# Patient Record
Sex: Male | Born: 1940 | Race: White | Hispanic: No | Marital: Married
Health system: Southern US, Community
[De-identification: ages and names within clinical notes are randomized; demographics above are authoritative.]

---

## 2007-02-08 ENCOUNTER — Ambulatory Visit: Payer: Self-pay | Admitting: Pulmonary Disease

## 2011-01-17 NOTE — Assessment & Plan Note (Signed)
[facility]                             PULMONARY OFFICE NOTE   CAMDAN, BURDI                     MRN:          [id number]  DATE:02/08/2007                            DOB:          [birth year]    HISTORY OF PRESENT ILLNESS:  The patient is a 70 year old gentleman who  I have been asked to see for obstructive sleep apnea.  The patient has  undergone nocturnal polysomnography on November 09, 2006 where he was found  to have moderate sleep apnea with respiratory disturbance index of 28  events per hour.  He was titrated to a final CPAP pressure of 8 cm  however continued to have breakthrough events.  The patient was seen by  Dr.  __________  and was started on CPAP and had great difficulty with  it.  The patient felt that he really could not wear this on a consistent  basis.  The patient has not been able to lose weight but has been using  a Breathe-Right strip which has helped somewhat with his sleep.  He  continues to have snoring as well as pauses in his breathing during  sleep.  He tentatively gets to bed between 10 and 11 and gets up at 6  a.m. to start his day, he is not rested upon arising.  The patient has  significant sleep pressure during the day with periods of inactivity but  feels that it does not impair his quality of life because he is retired.  He does not nap during the day but does have some pressure with long  driving.  He has no issues with short distance driving.  Of note, his  weight is neutral since his sleep study.   PAST MEDICAL HISTORY:  1. Hypertension.  2. History of sleep apnea.  3. Status post splenectomy.  4. __________  sinus surgery in 1984.   CURRENT MEDICATIONS:  1. Univasc 75 mg daily.  2. Aspirin 81 mg daily.   THE PATIENT HAS NO KNOWN DRUG ALLERGIES.   SOCIAL HISTORY:  He is married.  He has a history of smoking less than a  pack per day for 6 years, he has not smoked since 1967.   FAMILY HISTORY:   Remarkable for his father having asthma and heart  disease as well as prostate cancer.  His mother had breast cancer.   REVIEW OF SYSTEMS:  As per history of present illness, also see the  patient intake form documented in the chart.   PHYSICAL EXAMINATION:  GENERAL:  He is an overweight male in no acute  distress.  Blood pressure 130/78, pulse 80, temperature is 98, weight is  211 pounds, he is 5 foot 10 inches tall, O2 saturation on room air is  98%.  HEENT:  Pupils equal round, reactive to light and accommodation.  Extraocular muscles are intact, nares are narrow but patent.  Oropharynx  does show very long uvula with mild elongation of the soft palate.  NECK:  Supple without JVD or lymphadenopathy, without palpable  thyromegaly.  CHEST:  Totally clear.  CARDIO:  Reveals regular  rate and rhythm.  No murmurs, rubs, or gallops.  ABDOMEN:  Soft, nontender, with good bowel sounds.  GENITAL, RECTAL, BREAST:  Exam was not done and not indicated.  LOWER EXTREMITIES:  Are without edema and pulses are intact distally.  NEUROLOGICALLY:  He is alert and oriented, no obvious motor deficits.   IMPRESSION:  History of moderate obstructive sleep apnea.  Even if the  patient does not sleep as well as he would like to and does have sleep  pressure during the day with periods of inactivity he really does not  feel that it impairs his quality of life.  Because he is retired he is  able to adjust his schedule appropriately and is not sure that he wants  to go through aggressive treatment from a quality of life standpoint.  I  have also discussed the possibility of cardiovascular risk with him, but  I do not think it is overly excessive.  The patient states that the  cardiovascular risk may not be an issue for him.  At this point in time  I have outlined the treatment options including continuous positive  airway pressure as well as upper airway surgery and also oral appliance.  I had told him that he  really has not given continuous positive airway  pressure a fair shot, in that there are a lot of things we can do with  the various machines and masks in order to make it more comfortable for  him.  He would like to take some time and think about his various  options and will get back with me if he decides to pursue aggressive  treatment.   PLAN:  1. Work aggressively on weight loss.  2. The patient is thinking about retrying CPAP versus some of the      options.  He states that he may chose not to treat this at all.     Barbaraann Share, MD,FCCP  Electronically Signed    [facility]  DD: 02/14/2007  DT: 02/14/2007  Job #: [id number]   cc:   Dina Rich

## 2011-10-24 ENCOUNTER — Other Ambulatory Visit (HOSPITAL_COMMUNITY)
Admission: RE | Admit: 2011-10-24 | Discharge: 2011-10-24 | Disposition: A | Discharge status: Home / Self Care | Payer: Medicare Other | Source: Ambulatory Visit | Attending: Pathology | Admitting: Pathology

## 2011-10-24 DIAGNOSIS — C8589 Other specified types of non-Hodgkin lymphoma, extranodal and solid organ sites: Secondary | ICD-10-CM | POA: Insufficient documentation

## 2012-01-09 ENCOUNTER — Other Ambulatory Visit: Payer: Self-pay | Admitting: Dermatology

## 2012-07-15 ENCOUNTER — Other Ambulatory Visit: Payer: Self-pay | Admitting: Gastroenterology

## 2012-07-15 DIAGNOSIS — R1031 Right lower quadrant pain: Secondary | ICD-10-CM

## 2012-07-18 ENCOUNTER — Ambulatory Visit
Admission: RE | Admit: 2012-07-18 | Discharge: 2012-07-18 | Disposition: A | Discharge status: Home / Self Care | Payer: Medicare Other | Source: Ambulatory Visit | Attending: Gastroenterology | Admitting: Gastroenterology

## 2012-07-18 DIAGNOSIS — R1031 Right lower quadrant pain: Secondary | ICD-10-CM

## 2012-07-18 MED ORDER — IOHEXOL 350 MG/ML SOLN
30.0000 mL | Freq: Once | INTRAVENOUS | Status: AC | PRN
  Administered 2012-07-18: 30 mL via ORAL

## 2012-07-18 MED ORDER — IOHEXOL 300 MG/ML  SOLN
125.0000 mL | Freq: Once | INTRAMUSCULAR | Status: AC | PRN
  Administered 2012-07-18: 125 mL via INTRAVENOUS

## 2015-11-04 DIAGNOSIS — M2012 Hallux valgus (acquired), left foot: Secondary | ICD-10-CM | POA: Insufficient documentation

## 2015-11-04 DIAGNOSIS — M7742 Metatarsalgia, left foot: Secondary | ICD-10-CM | POA: Insufficient documentation

## 2015-11-04 DIAGNOSIS — M2021 Hallux rigidus, right foot: Secondary | ICD-10-CM | POA: Insufficient documentation

## 2015-11-04 DIAGNOSIS — M2042 Other hammer toe(s) (acquired), left foot: Secondary | ICD-10-CM | POA: Insufficient documentation

## 2015-12-06 ENCOUNTER — Other Ambulatory Visit: Payer: Self-pay | Admitting: Nurse Practitioner

## 2015-12-06 ENCOUNTER — Ambulatory Visit
Admission: RE | Admit: 2015-12-06 | Discharge: 2015-12-06 | Disposition: A | Discharge status: Home / Self Care | Payer: Medicare Other | Source: Ambulatory Visit | Attending: Nurse Practitioner | Admitting: Nurse Practitioner

## 2015-12-06 DIAGNOSIS — M25551 Pain in right hip: Secondary | ICD-10-CM

## 2016-05-23 ENCOUNTER — Other Ambulatory Visit: Payer: Self-pay | Admitting: Internal Medicine

## 2016-05-23 DIAGNOSIS — Z0000 Encounter for general adult medical examination without abnormal findings: Secondary | ICD-10-CM

## 2016-06-02 ENCOUNTER — Ambulatory Visit
Admission: RE | Admit: 2016-06-02 | Discharge: 2016-06-02 | Disposition: A | Discharge status: Home / Self Care | Payer: Medicare Other | Source: Ambulatory Visit | Attending: Internal Medicine | Admitting: Internal Medicine

## 2016-06-02 DIAGNOSIS — Z0000 Encounter for general adult medical examination without abnormal findings: Secondary | ICD-10-CM

## 2017-01-09 ENCOUNTER — Other Ambulatory Visit: Payer: Self-pay | Admitting: Otolaryngology

## 2017-01-09 DIAGNOSIS — H903 Sensorineural hearing loss, bilateral: Secondary | ICD-10-CM

## 2017-01-09 DIAGNOSIS — H9312 Tinnitus, left ear: Secondary | ICD-10-CM

## 2017-01-30 ENCOUNTER — Ambulatory Visit
Admission: RE | Admit: 2017-01-30 | Discharge: 2017-01-30 | Disposition: A | Discharge status: Home / Self Care | Payer: Medicare Other | Source: Ambulatory Visit | Attending: Otolaryngology | Admitting: Otolaryngology

## 2017-01-30 DIAGNOSIS — H903 Sensorineural hearing loss, bilateral: Secondary | ICD-10-CM

## 2017-01-30 DIAGNOSIS — H9312 Tinnitus, left ear: Secondary | ICD-10-CM

## 2017-01-30 MED ORDER — GADOBENATE DIMEGLUMINE 529 MG/ML IV SOLN
20.0000 mL | Freq: Once | INTRAVENOUS | Status: AC | PRN
  Administered 2017-01-30: 20 mL via INTRAVENOUS

## 2017-02-19 ENCOUNTER — Telehealth (HOSPITAL_COMMUNITY): Payer: Self-pay | Admitting: Radiology

## 2017-02-19 NOTE — Telephone Encounter (Signed)
Called pt to schedule consult/cerebral angiogram at the request of Dr. Thornell Mule. Pt has an appt with Dr. Thornell Mule on 02/20/17 and will call us back after that visit to schedule. JM

## 2017-02-21 ENCOUNTER — Other Ambulatory Visit: Payer: Self-pay | Admitting: Otolaryngology

## 2017-02-21 DIAGNOSIS — H9312 Tinnitus, left ear: Secondary | ICD-10-CM

## 2017-02-23 ENCOUNTER — Ambulatory Visit
Admission: RE | Admit: 2017-02-23 | Discharge: 2017-02-23 | Disposition: A | Discharge status: Home / Self Care | Payer: Medicare Other | Source: Ambulatory Visit | Attending: Otolaryngology | Admitting: Otolaryngology

## 2017-02-23 DIAGNOSIS — H9312 Tinnitus, left ear: Secondary | ICD-10-CM

## 2017-05-29 ENCOUNTER — Ambulatory Visit
Admission: RE | Admit: 2017-05-29 | Discharge: 2017-05-29 | Disposition: A | Discharge status: Home / Self Care | Payer: Medicare Other | Source: Ambulatory Visit | Attending: Internal Medicine | Admitting: Internal Medicine

## 2017-05-29 ENCOUNTER — Other Ambulatory Visit: Payer: Self-pay | Admitting: Internal Medicine

## 2017-05-29 DIAGNOSIS — M5441 Lumbago with sciatica, right side: Secondary | ICD-10-CM

## 2017-05-29 DIAGNOSIS — M25551 Pain in right hip: Secondary | ICD-10-CM

## 2017-06-04 ENCOUNTER — Ambulatory Visit: Payer: Medicare Other | Attending: Internal Medicine | Admitting: Rehabilitation

## 2017-06-04 ENCOUNTER — Encounter: Payer: Self-pay | Admitting: Rehabilitation

## 2017-06-04 DIAGNOSIS — M79604 Pain in right leg: Secondary | ICD-10-CM | POA: Insufficient documentation

## 2017-06-04 DIAGNOSIS — M545 Low back pain: Secondary | ICD-10-CM | POA: Insufficient documentation

## 2017-06-04 DIAGNOSIS — G8929 Other chronic pain: Secondary | ICD-10-CM | POA: Insufficient documentation

## 2017-06-04 DIAGNOSIS — M79605 Pain in left leg: Secondary | ICD-10-CM | POA: Diagnosis present

## 2017-06-04 NOTE — Therapy (Signed)
[address] [place], [place], [address] Phone: [phone]   Fax:  [phone]  Physical Therapy Evaluation  Patient Details  Name: Douglas Burch. MRN: [id number] Date of Birth: 12/19/1940 Referring Provider: Lavone Orn MD  Encounter Date: 06/04/2017      PT End of Session - 06/04/17 1640    Visit Number 1   Number of Visits 12   Date for PT Re-Evaluation 07/16/17   Authorization Type Medicare; 10th visit Gcode; KX 15th visit   PT Start Time 1402   PT Stop Time 1445   PT Time Calculation (min) 43 min   Activity Tolerance Patient tolerated treatment well   Behavior During Therapy [facility] for tasks assessed/performed      History reviewed. No pertinent past medical history.  History reviewed. No pertinent surgical history.  There were no vitals filed for this visit.       Subjective Assessment - 06/04/17 1405    Subjective Pt presents with pain in the back, hips, knees, and feet x years mainly due to OA.  xrays showing only mild degeneration in the hips, knees, and back.  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  Feels like walking has slowed down.     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 No/denies  pain only with movement   Pain Score 7    Pain Location Back  and bilateral hips   Pain Orientation Right;Left   Pain Descriptors / Indicators Aching;Stabbing   Pain Type Chronic pain   Pain Radiating Towards LEs   Pain Onset More than a month ago   Pain Frequency Intermittent   Aggravating Factors  mostly movement related, getting up and down from chairs   Pain Relieving Factors rest   Effect of Pain on Daily Activities limits ability to be active            [facility] PT Assessment - 06/04/17 0001      Assessment   Medical Diagnosis back, hip, and knee pain   Referring Provider Lavone Orn MD   Onset Date/Surgical Date 09/04/13   Next MD Visit unknown   Prior Therapy no     Precautions   Precautions None     Balance Screen   Has the patient fallen in the past 6 months No     [place] residence   Living Arrangements Spouse/significant other   Home Layout --  2 steps into home     Prior Function   Level of [place] Retired   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      Observation/Other Assessments   Focus on Therapeutic Outcomes (FOTO)  46% limited     Sensation   Additional Comments reports maybe decreased sensation     Functional Tests   Functional tests Sit to Stand;Single leg stance     Single Leg Stance   Comments 1" bil      Sit to Stand   Comments pain in low back x 5 15seconds     ROM / Strength   AROM / PROM / Strength AROM;PROM;Strength     AROM   AROM Assessment Site Lumbar  Lumbar Flexion fingers to mid shins with increased leg and back pain   Lumbar Extension decreased 50%   pain   Lumbar - Right Side Bend decreased 25%  pain   Lumbar - Left Side Bend decreased 25%   pain   Lumbar - Right Rotation decreased 25%  pain down leg   Lumbar - Left Rotation decreased 25%     PROM   Overall PROM Comments significant tightness bil hip PROM mainly muscular      Strength   Overall Strength Comments seated knee and ankle 5/5 bil   Strength Assessment Site Hip;Knee;Ankle   Right/Left Hip Right;Left   Right Hip Flexion 5/5   Right Hip External Rotation  5/5   Right Hip Internal Rotation 5/5   Right Hip ABduction 4+/5   Left Hip Flexion 5/5   Left Hip External Rotation 5/5   Left Hip Internal Rotation 5/5   Left Hip ABduction 4+/5   Right/Left Knee --   Right/Left Ankle --     Flexibility   Soft Tissue Assessment /Muscle Length yes   Hamstrings to 45 bil    Quadriceps 50% limited bil inprone   Piriformis tightness bil   Quadratus Lumborum tightness bil     Palpation   SI assessment  2/6 CPA all lumbar levels (pt with 6 lumbar per xray) pain with all especially L3-6   Palpation comment tightness and tenderness lumbar region            Objective measurements completed on examination: See above findings.                  PT Education - 06/04/17 1640    Education provided Yes   Education Details HEP, diagnosis, POC   Person(s) Educated Patient   Methods Explanation;Handout   Comprehension Verbalized understanding;Need further instruction             PT Long Term Goals - 06/04/17 1645      PT LONG TERM GOAL #1   Title Pt will improve lumbar AROM to painfree within available motion   Baseline pain with all motions   Time 6   Period Weeks   Status New   Target Date 07/16/17     PT LONG TERM GOAL #2   Title Pt will report being able to get out of his chair at home without difficulty   Time 6   Period Weeks   Status New   Target Date 07/16/17     PT LONG TERM GOAL #3   Title pt will report being able to get in and out of his car without limitations   Time 6   Period Weeks   Status New   Target Date 07/16/17     PT LONG TERM GOAL #4   Title Pt will be independent with HEP for continued mobility   Time 6   Period Weeks   Status New   Target Date 07/16/17                Plan - 06/04/17 1642    Clinical Impression Statement Pt presents with chronic back, hip, and knee pain limiting activity and mobility.  Objective findings today include excellent strength in the LEs upon MMT but significant tightness in bil LEs including hamstrings, quads, hip rotators, and lumbar spine.  Tightness and ttp in the lumbar spine globally with pain during CPAs of L3-6 (pt with 6 levels per xray)  Pt does report increased back pain after  eval from moving more than usual.     Clinical Presentation Stable   Clinical  Decision Making Low   Rehab Potential Good   PT Frequency 2x / week   PT Duration 6 weeks   PT Treatment/Interventions Electrical Stimulation;Moist Heat;Patient/family education;Therapeutic exercise;Therapeutic activities;Neuromuscular re-education;Manual techniques;Taping   PT Next Visit Plan review HEP with education on performance, begin LE/lumbar mobility and core strengthening   PT Home Exercise Plan given initial program 10/1   Consulted and Agree with Plan of Care Patient      Patient will benefit from skilled therapeutic intervention in order to improve the following deficits and impairments:  Decreased range of motion, Decreased mobility, Difficulty walking  Visit Diagnosis: Chronic midline low back pain without sciatica - Plan: PT plan of care cert/re-cert  Pain in left leg - Plan: PT plan of care cert/re-cert  Pain in right leg - Plan: PT plan of care cert/re-cert      G-Codes - 00/93/81 1649    Functional Assessment Tool Used (Outpatient Only) FOTO   Functional Limitation Mobility: Walking and moving around   Mobility: Walking and Moving Around Current Status (W2993) At least 40 percent but less than 60 percent impaired, limited or restricted   Mobility: Walking and Moving Around Goal Status (Z1696) At least 20 percent but less than 40 percent impaired, limited or restricted       Problem List There are no active problems to display for this patient.   Stark Bray, DPT, CMP 06/04/2017, 4:51 PM  [address] [place], [place], [address] Phone: [phone]   Fax:  [phone]  Name: Ion Gonnella. MRN: [id number] Date of Birth: [birth year]

## 2017-06-06 ENCOUNTER — Ambulatory Visit: Payer: Medicare Other | Admitting: Physical Therapy

## 2017-06-06 ENCOUNTER — Encounter: Payer: Self-pay | Admitting: Physical Therapy

## 2017-06-06 DIAGNOSIS — G8929 Other chronic pain: Secondary | ICD-10-CM

## 2017-06-06 DIAGNOSIS — M79604 Pain in right leg: Secondary | ICD-10-CM

## 2017-06-06 DIAGNOSIS — M545 Low back pain: Secondary | ICD-10-CM

## 2017-06-06 DIAGNOSIS — M79605 Pain in left leg: Secondary | ICD-10-CM

## 2017-06-06 NOTE — Therapy (Signed)
[address] [place], [place], [address] Phone: [phone]   Fax:  [phone]  Physical Therapy Treatment  Patient Details  Name: Douglas Burch. MRN: [id number] Date of Birth: 07/09/1941 Referring Provider: Lavone Orn MD  Encounter Date: 06/06/2017      PT End of Session - 06/06/17 0925    Visit Number 2   Date for PT Re-Evaluation 07/16/17   PT Start Time 0845   PT Stop Time 0927   PT Time Calculation (min) 42 min   Activity Tolerance Patient tolerated treatment well   Behavior During Therapy [facility] for tasks assessed/performed      History reviewed. No pertinent past medical history.  History reviewed. No pertinent surgical history.  There were no vitals filed for this visit.      Subjective Assessment - 06/06/17 0854    Subjective Pt reports that his back is a little more symptomatic today hue to the moves from the evaluation.    Currently in Pain? Yes   Pain Score 0-No pain                         [facility] Adult PT Treatment/Exercise - 06/06/17 0001      Exercises   Exercises Lumbar     Lumbar Exercises: Stretches   Passive Hamstring Stretch 10 seconds;5 reps   Single Knee to Chest Stretch 2 reps;10 seconds   Lower Trunk Rotation 3 reps;10 seconds     Lumbar Exercises: Aerobic   Stationary Bike NuStep L3 x 6 min     Lumbar Exercises: Machines for Strengthening   Other Lumbar Machine Exercise 25lb Rows and lats 2x10      Lumbar Exercises: Supine   Ab Set 10 reps;3 seconds   Bridge 10 reps;2 seconds                     PT Long Term Goals - 06/06/17 0929      PT LONG TERM GOAL #4   Title Pt will be independent with HEP for continued mobility   Status Partially Met               Plan - 06/06/17 0927    Clinical Impression Statement Reviewed HEP, Pt tolerated an initial progression to exercises well. Bilat HS tightness noted with  passive HS stretch and lower trunk rotations. Little ROM with supine bridges. No reports of increase pain with activity. Denied modality post treatment.   Rehab Potential Good   PT Frequency 2x / week   PT Duration 6 weeks   PT Next Visit Plan begin LE/lumbar mobility and core strengthening      Patient will benefit from skilled therapeutic intervention in order to improve the following deficits and impairments:  Decreased range of motion, Decreased mobility, Difficulty walking  Visit Diagnosis: Pain in left leg  Chronic midline low back pain without sciatica  Pain in right leg     Problem List There are no active problems to display for this patient.   Scot Jun, PTA 06/06/2017, 9:30 AM  [address] [place], [place], [address] Phone: [phone]   Fax:  [phone]  Name: Michail Boyte. MRN: [id number] Date of Birth: 1941-04-06

## 2017-06-11 ENCOUNTER — Ambulatory Visit: Payer: Medicare Other | Admitting: Physical Therapy

## 2017-06-11 ENCOUNTER — Encounter: Payer: Self-pay | Admitting: Physical Therapy

## 2017-06-11 DIAGNOSIS — M545 Low back pain: Secondary | ICD-10-CM | POA: Diagnosis not present

## 2017-06-11 DIAGNOSIS — G8929 Other chronic pain: Secondary | ICD-10-CM

## 2017-06-11 DIAGNOSIS — M79604 Pain in right leg: Secondary | ICD-10-CM

## 2017-06-11 DIAGNOSIS — M79605 Pain in left leg: Secondary | ICD-10-CM

## 2017-06-11 NOTE — Therapy (Signed)
[address] [place], [place], [address] Phone: [phone]   Fax:  [phone]  Physical Therapy Treatment  Patient Details  Name: Douglas Burch. MRN: [id number] Date of Birth: 07-Nov-1940 Referring Provider: Lavone Orn MD  Encounter Date: 06/11/2017      PT End of Session - 06/11/17 1735    Visit Number 3   Date for PT Re-Evaluation 07/16/17   Authorization Type Medicare; 10th visit Gcode; KX 15th visit   PT Start Time 8366   PT Stop Time 1745   PT Time Calculation (min) 41 min   Activity Tolerance Patient tolerated treatment well   Behavior During Therapy [facility] for tasks assessed/performed      History reviewed. No pertinent past medical history.  History reviewed. No pertinent surgical history.  There were no vitals filed for this visit.      Subjective Assessment - 06/11/17 1706    Subjective Reports that he is doing a little better, reports that he is sore with any twisting motions   Currently in Pain? Yes   Pain Score 2    Pain Location Back   Aggravating Factors  twisting                         [facility] Adult PT Treatment/Exercise - 06/11/17 0001      Lumbar Exercises: Stretches   Passive Hamstring Stretch 4 reps;20 seconds   Piriformis Stretch 4 reps;20 seconds     Lumbar Exercises: Aerobic   Stationary Bike NuStep L5 x 6 min     Lumbar Exercises: Machines for Strengthening   Other Lumbar Machine Exercise 25lb Rows and lats 2x10      Lumbar Exercises: Standing   Other Standing Lumbar Exercises hip extension 10x each leg   Other Standing Lumbar Exercises green tband anti rotation press     Lumbar Exercises: Supine   Large Ball Abdominal Isometric 15 reps;2 seconds   Other Supine Lumbar Exercises feet on ball K2C, rotation and small bridges                     PT Long Term Goals - 06/11/17 1742      PT LONG TERM GOAL #1   Title Pt will  improve lumbar AROM to painfree within available motion   Status On-going     PT LONG TERM GOAL #2   Title Pt will report being able to get out of his chair at home without difficulty   Status On-going               Plan - 06/11/17 1740    Clinical Impression Statement Patient had some pain with small bridges and with hip extension.  Needed cues to go slow and do small motions in a painfree ROM   PT Next Visit Plan continue with core stability going slow   Consulted and Agree with Plan of Care Patient      Patient will benefit from skilled therapeutic intervention in order to improve the following deficits and impairments:  Decreased range of motion, Decreased mobility, Difficulty walking  Visit Diagnosis: Pain in left leg  Chronic midline low back pain without sciatica  Pain in right leg     Problem List There are no active problems to display for this patient.   Sumner Boast., PT 06/11/2017, 5:42 PM  [address]  [place], [place], [address] Phone: [phone]   Fax:  [phone]  Name: Douglas Burch. MRN: [id number] Date of Birth: April 29, 1941

## 2017-06-14 ENCOUNTER — Encounter: Payer: Self-pay | Admitting: Physical Therapy

## 2017-06-14 ENCOUNTER — Ambulatory Visit: Payer: Medicare Other | Admitting: Physical Therapy

## 2017-06-14 DIAGNOSIS — M545 Low back pain, unspecified: Secondary | ICD-10-CM

## 2017-06-14 DIAGNOSIS — G8929 Other chronic pain: Secondary | ICD-10-CM

## 2017-06-14 DIAGNOSIS — M79604 Pain in right leg: Secondary | ICD-10-CM

## 2017-06-14 DIAGNOSIS — M79605 Pain in left leg: Secondary | ICD-10-CM

## 2017-06-14 NOTE — Therapy (Signed)
[address] [place], [place], [address] Phone: [phone]   Fax:  [phone]  Physical Therapy Treatment  Patient Details  Name: Douglas Burch. MRN: [id number] Date of Birth: Jan 12, 1941 Referring Provider: Lavone Orn MD  Encounter Date: 06/14/2017      PT End of Session - 06/14/17 1144    Visit Number 4   Date for PT Re-Evaluation 07/16/17   PT Start Time 1058   PT Stop Time 1144   PT Time Calculation (min) 46 min   Activity Tolerance Patient tolerated treatment well   Behavior During Therapy [facility] for tasks assessed/performed      History reviewed. No pertinent past medical history.  History reviewed. No pertinent surgical history.  There were no vitals filed for this visit.      Subjective Assessment - 06/14/17 1057    Subjective Pt reports that he is having some low back soreness because he has been leaning over working on his toilet seat   Currently in Pain? No/denies   Pain Score 0-No pain                         [facility] Adult PT Treatment/Exercise - 06/14/17 0001      Lumbar Exercises: Stretches   Passive Hamstring Stretch 4 reps;20 seconds   Piriformis Stretch 4 reps;20 seconds     Lumbar Exercises: Machines for Strengthening   Cybex Knee Flexion 25lb 2x10   Other Lumbar Machine Exercise 25lb Rows and lats 2x10      Lumbar Exercises: Standing   Other Standing Lumbar Exercises hip extension 10x each leg   Other Standing Lumbar Exercises anti rotation press 15lb x10     Lumbar Exercises: Supine   Large Ball Abdominal Isometric 15 reps;2 seconds   Other Supine Lumbar Exercises feet on ball K2C, rotation and small bridges                     PT Long Term Goals - 06/11/17 1742      PT LONG TERM GOAL #1   Title Pt will improve lumbar AROM to painfree within available motion   Status On-going     PT LONG TERM GOAL #2   Title Pt will report  being able to get out of his chair at home without difficulty   Status On-going               Plan - 06/14/17 1144    Clinical Impression Statement no pain reported with small bridges, postural cues give during seated rows and anti rotational pressed.   Rehab Potential Good   PT Frequency 2x / week   PT Treatment/Interventions Electrical Stimulation;Moist Heat;Patient/family education;Therapeutic exercise;Therapeutic activities;Neuromuscular re-education;Manual techniques;Taping   PT Next Visit Plan continue with core stability going slow      Patient will benefit from skilled therapeutic intervention in order to improve the following deficits and impairments:  Decreased range of motion, Decreased mobility, Difficulty walking  Visit Diagnosis: Pain in left leg  Pain in right leg  Chronic midline low back pain without sciatica     Problem List There are no active problems to display for this patient.   Scot Jun 06/14/2017, 11:45 AM  [address] [place], [place], [address] Phone: [phone]   Fax:  [phone]  Name: Douglas Burch. MRN: [id number] Date  of Birth: 25-Feb-1941

## 2017-06-22 ENCOUNTER — Ambulatory Visit: Payer: Medicare Other | Admitting: Physical Therapy

## 2017-06-22 ENCOUNTER — Encounter: Payer: Self-pay | Admitting: Physical Therapy

## 2017-06-22 DIAGNOSIS — M79605 Pain in left leg: Secondary | ICD-10-CM

## 2017-06-22 DIAGNOSIS — M545 Low back pain: Secondary | ICD-10-CM

## 2017-06-22 DIAGNOSIS — M79604 Pain in right leg: Secondary | ICD-10-CM

## 2017-06-22 DIAGNOSIS — G8929 Other chronic pain: Secondary | ICD-10-CM

## 2017-06-22 NOTE — Therapy (Signed)
[facility] [address] [place], [place], [address] Phone: [phone]   Fax:  [phone]  Physical Therapy Treatment  Patient Details  Name: Douglas Burch. MRN: [id number] Date of Birth: [birth year] Referring Provider: Kirby Funk MD  Encounter Date: 06/22/2017      PT End of Session - 06/22/17 1006    Visit Number 5   Date for PT Re-Evaluation 07/16/17   PT Start Time 0930   PT Stop Time 1019   PT Time Calculation (min) 49 min   Activity Tolerance Patient tolerated treatment well   Behavior During Therapy [facility] for tasks assessed/performed      History reviewed. No pertinent past medical history.  History reviewed. No pertinent surgical history.  There were no vitals filed for this visit.      Subjective Assessment - 06/22/17 0930    Subjective Reports that his back is feeling better.  REportst hat he has not done his exercises very much   Currently in Pain? No/denies                         [facility] Adult PT Treatment/Exercise - 06/22/17 0001      Lumbar Exercises: Stretches   Passive Hamstring Stretch 4 reps;20 seconds   Piriformis Stretch 4 reps;20 seconds     Lumbar Exercises: Aerobic   Stationary Bike NuStep L5 x 6 min     Lumbar Exercises: Machines for Strengthening   Cybex Knee Extension 5# 2x10   Cybex Knee Flexion 25lb 2x10   Leg Press 20# 2x10   Other Lumbar Machine Exercise 25lb Rows and lats 2x10      Lumbar Exercises: Standing   Other Standing Lumbar Exercises anti rotation press 15lb x10     Lumbar Exercises: Supine   Other Supine Lumbar Exercises feet on ball K2C, rotation and small bridges                     PT Long Term Goals - 06/22/17 1008      PT LONG TERM GOAL #1   Title Pt will improve lumbar AROM to painfree within available motion   Status Achieved     PT LONG TERM GOAL #2   Title Pt will report being able to get out of his  chair at home without difficulty   Status Partially Met     PT LONG TERM GOAL #3   Title pt will report being able to get in and out of his car without limitations   Status Partially Met     PT LONG TERM GOAL #4   Title Pt will be independent with HEP for continued mobility   Status Partially Met               Plan - 06/22/17 1007    Clinical Impression Statement Reports overall doing very well, wants to try the Y, yoga and water aerobics   PT Next Visit Plan educate on posture and body mechanics as well as gym activities and form   Consulted and Agree with Plan of Care Patient      Patient will benefit from skilled therapeutic intervention in order to improve the following deficits and impairments:  Decreased range of motion, Decreased mobility, Difficulty walking  Visit Diagnosis: Pain in left leg  Pain in right leg  Chronic midline low back pain without sciatica     Problem List There are  no active problems to display for this patient.   Analina Filla W.,PT 06/22/2017, 10:10 AM  [address] [place], [place], [address] Phone: [phone]   Fax:  [phone]  Name: Douglas Burch. MRN: [id number] Date of Birth: 1940-10-09

## 2017-06-25 ENCOUNTER — Ambulatory Visit: Payer: Medicare Other | Admitting: Physical Therapy

## 2017-06-25 ENCOUNTER — Encounter: Payer: Self-pay | Admitting: Physical Therapy

## 2017-06-25 DIAGNOSIS — G8929 Other chronic pain: Secondary | ICD-10-CM

## 2017-06-25 DIAGNOSIS — M79604 Pain in right leg: Secondary | ICD-10-CM

## 2017-06-25 DIAGNOSIS — M545 Low back pain: Secondary | ICD-10-CM | POA: Diagnosis not present

## 2017-06-25 DIAGNOSIS — M79605 Pain in left leg: Secondary | ICD-10-CM

## 2017-06-25 NOTE — Therapy (Signed)
[address] [place], [place], [address] Phone: [phone]   Fax:  [phone]  Physical Therapy Treatment  Patient Details  Name: Douglas Burch. MRN: [id number] Date of Birth: [birth year] Referring Provider: Lavone Orn MD  Encounter Date: 06/25/2017      PT End of Session - 06/25/17 1057    Visit Number 6   Date for PT Re-Evaluation 07/16/17   PT Start Time 0930   PT Stop Time 1013   PT Time Calculation (min) 43 min   Activity Tolerance Patient tolerated treatment well   Behavior During Therapy [facility] for tasks assessed/performed      History reviewed. No pertinent past medical history.  History reviewed. No pertinent surgical history.  There were no vitals filed for this visit.      Subjective Assessment - 06/25/17 0934    Subjective Reports that he was pretty sore after the last visit.  Would like to hvae information about going to the gym   Currently in Pain? No/denies                         [facility] Adult PT Treatment/Exercise - 06/25/17 0001      Therapeutic Activites    Therapeutic Activities ADL's;Lifting   ADL's Vacuum, yardwork, lifting, foot prop, golfer's lift, in and out of car   Lifting reviewed HEP and gym activities, proper form, weight, reps and sets, how to slowly and safely progress, went over water aerobics vs on own, walking in water and kicking for exercises     Lumbar Exercises: Aerobic   Stationary Bike NuStep L5 x 6 min     Lumbar Exercises: Machines for Strengthening   Cybex Knee Extension 5# 2x10   Other Lumbar Machine Exercise 25lb Rows and lats 2x10                      PT Long Term Goals - 06/25/17 1058      PT LONG TERM GOAL #1   Title Pt will improve lumbar AROM to painfree within available motion   Status Achieved     PT LONG TERM GOAL #2   Title Pt will report being able to get out of his chair at home without  difficulty   Status Achieved     PT LONG TERM GOAL #3   Title pt will report being able to get in and out of his car without limitations   Status Partially Met     PT LONG TERM GOAL #4   Title Pt will be independent with HEP for continued mobility   Status Achieved               Plan - 06/25/17 1057    Clinical Impression Statement Patient doing very well, had questions about proper body mechanics and how to return to the gym safely.  We went over all of this and he feels good about it   PT Next Visit Plan hold for 1 month as he tries HEP and gym on his own   Consulted and Agree with Plan of Care Patient      Patient will benefit from skilled therapeutic intervention in order to improve the following deficits and impairments:  Decreased range of motion, Decreased mobility, Difficulty walking  Visit Diagnosis: Pain in left leg  Pain in right leg  Chronic midline low back pain without sciatica  G-Codes - 06/25/17 1059    Functional Assessment Tool Used (Outpatient Only) foto 37% limitation   Functional Limitation Mobility: Walking and moving around   Mobility: Walking and Moving Around Current Status [phone]) At least 20 percent but less than 40 percent impaired, limited or restricted   Mobility: Walking and Moving Around Goal Status [phone]) At least 20 percent but less than 40 percent impaired, limited or restricted      Problem List There are no active problems to display for this patient.   Sumner Boast., PT 06/25/2017, 11:04 AM  [address] [place], [place], [address] Phone: [phone]   Fax:  [phone]  Name: Douglas Burch. MRN: [id number] Date of Birth: 02-02-1941

## 2018-06-17 ENCOUNTER — Ambulatory Visit: Payer: Medicare Other | Attending: Internal Medicine | Admitting: Physical Therapy

## 2018-06-17 ENCOUNTER — Encounter: Payer: Self-pay | Admitting: Physical Therapy

## 2018-06-17 DIAGNOSIS — G8929 Other chronic pain: Secondary | ICD-10-CM | POA: Insufficient documentation

## 2018-06-17 DIAGNOSIS — M5441 Lumbago with sciatica, right side: Secondary | ICD-10-CM | POA: Insufficient documentation

## 2018-06-17 DIAGNOSIS — M79604 Pain in right leg: Secondary | ICD-10-CM | POA: Diagnosis present

## 2018-06-17 DIAGNOSIS — M62830 Muscle spasm of back: Secondary | ICD-10-CM | POA: Diagnosis present

## 2018-06-17 NOTE — Patient Instructions (Signed)
<HTML><META HTTP-EQUIV="content-type" CONTENT="text/html;charset=utf-8"><STRONG>             Access Code: [email or web address]       <BR>URL: [email or web address]       <BR><SPAN id="[email or web address]">Date: 06/17/2018</SPAN>                                                     <BR>Prepared by: Lum Babe                             </STRONG>      <BR><BR><SPAN>Exercises</SPAN>      <UL class="program-note-list"><LI>Supine ITB Stretch with Strap                 - 5 reps                 - 3 sets                 - 20 hold                         - 2x daily                         - 7x weekly                </LI><LI>Supine Piriformis Stretch Pulling Heel to Hip                 - 5 reps                 - 3 sets                 - 20 hold                         - 2x daily                         - 7x weekly                </LI><LI>Seated Hamstring Stretch with Chair                 - 5 reps                 - 3 sets                 - 60 hold                         - 2x daily                         - 7x weekly                </LI><LI>Standing Gastroc Stretch on Step with Counter Support                 - 5 reps                 - 3 sets                 - 20 hold                         -  2x daily                         - 7x weekly                </LI></UL>

## 2018-06-17 NOTE — Therapy (Signed)
[address] [place], [place], [address] Phone: [phone]   Fax:  [phone]  Physical Therapy Evaluation  Patient Details  Name: Douglas Burch. MRN: [id number] Date of Birth: 1941-02-11 Referring Provider (PT): Lavone Orn MD   Encounter Date: 06/17/2018  PT End of Session - 06/17/18 0837    Visit Number  1    Date for PT Re-Evaluation  08/17/18    PT Start Time  0800    PT Stop Time  0850    PT Time Calculation (min)  50 min    Activity Tolerance  Patient tolerated treatment well    Behavior During Therapy  [facility] for tasks assessed/performed       History reviewed. No pertinent past medical history.  History reviewed. No pertinent surgical history.  There were no vitals filed for this visit.   Subjective Assessment - 06/17/18 0805    Subjective  Patient was seen here last year with back pain with good results.  Reports that recently he has been having posterior right leg pain, at times down to the ankle, reports that his most frequent c/o is a cramping in the left calf.      Diagnostic tests  xray of lumbar spine showing 6 lumbar levels with DDD, mild OA bil hips and knees    Patient Stated Goals  have less pain    Currently in Pain?  Yes    Pain Score  3     Pain Location  Back    Pain Orientation  Lower;Right    Pain Descriptors / Indicators  Aching;Cramping    Pain Type  Acute pain    Pain Radiating Towards  right posterior leg worse in the right calf    Pain Onset  More than a month ago    Pain Frequency  Constant    Aggravating Factors   reports nothing really correlates the pain , but reports that when it hurts the pain is a 9/10    Pain Relieving Factors  reports that he has tried a lot of different things gets mild relief but does not go away    Effect of Pain on Daily Activities  limits activity         [facility] PT Assessment - 06/17/18 0001      Assessment   Medical  Diagnosis  LBP with right sciatica    Referring Provider (PT)  Lavone Orn MD    Onset Date/Surgical Date  05/18/18    Prior Therapy  over a year ago with good results      Precautions   Precautions  None      Balance Screen   Has the patient fallen in the past 6 months  No    Has the patient had a decrease in activity level because of a fear of falling?   No    Is the patient reluctant to leave their home because of a fear of falling?   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     Leisure  no exercise      Posture/Postural Control   Posture Comments  decreased  lordosis, slouched sitting posture      AROM   AROM Assessment Site  Lumbar    Lumbar Extension  decreased 50%     Lumbar - Right Side Bend  decreased 25%    Lumbar - Left Side Bend  decreased 50%     Lumbar - Right Rotation  decreased 50%    Lumbar - Left Rotation  decreased 50%      Strength   Overall Strength Comments  4/5 with LE's      Flexibility   Soft Tissue Assessment /Muscle Length  yes    Hamstrings  to 50 degrees pain doen the leg on the right    Piriformis  tight      Palpation   Palpation comment  very tender                 Objective measurements completed on examination: See above findings.      [place] Adult PT Treatment/Exercise - 06/17/18 0001      Modalities   Modalities  Electrical Stimulation;Moist Heat      Moist Heat Therapy   Number Minutes Moist Heat  15 Minutes    Moist Heat Location  Lumbar Spine      Electrical Stimulation   Electrical Stimulation Location  lumbar    Electrical Stimulation Action  IFC    Electrical Stimulation Parameters  supine    Electrical Stimulation Goals  Pain             PT  Education - 06/17/18 0837    Education provided  Yes    Education Details  Wms flexion    Person(s) Educated  Patient    Methods  Explanation;Demonstration;Handout    Comprehension  Verbalized understanding       PT Short Term Goals - 06/17/18 0840      PT SHORT TERM GOAL #1   Title  independent with initial HEP    Time  2    Period  Weeks    Status  New        PT Long Term Goals - 06/17/18 0840      PT LONG TERM GOAL #1   Title  Pt will improve lumbar AROM to painfree within available motion    Time  8    Period  Weeks    Status  New      PT LONG TERM GOAL #2   Title  Pt will report being able to get out of his chair at home without difficulty    Time  8    Period  Weeks    Status  New      PT LONG TERM GOAL #3   Title  decrease pain 50%    Time  8    Period  Weeks    Status  New      PT LONG TERM GOAL #4   Title  report 50% less calf pain    Time  8    Period  Weeks    Status  New             Plan - 06/17/18 3295    Clinical Impression Statement  Patient seen here about a year ago and got better fairly quickly, reports that a few months ago he started having back pain again with right leg pain and a cramping in the right calf.  X-rays show DDD.  Lumbar ROM is decreased 50% his LE's are tight.  Tender with spasms in  the lumbar and right buttock    Clinical Presentation  Stable    Clinical Decision Making  Low    PT Frequency  2x / week    PT Duration  6 weeks    PT Treatment/Interventions  Electrical Stimulation;Moist Heat;Patient/family education;Therapeutic exercise;Therapeutic activities;Neuromuscular re-education;Manual techniques;Taping;ADLs/Self Care Home Management;Traction;Cryotherapy;Dry needling    PT Next Visit Plan  start gym activites for flexibility and strength and may try traction    Consulted and Agree with Plan of Care  Patient       Patient will benefit from skilled therapeutic intervention in order to improve the following  deficits and impairments:  Decreased range of motion, Difficulty walking, Increased muscle spasms, Decreased activity tolerance, Pain, Improper body mechanics, Impaired flexibility, Decreased mobility, Decreased strength, Postural dysfunction  Visit Diagnosis: Chronic bilateral low back pain with right-sided sciatica - Plan: PT plan of care cert/re-cert  Muscle spasm of back - Plan: PT plan of care cert/re-cert     Problem List There are no active problems to display for this patient.   Sumner Boast., PT 06/17/2018, 8:42 AM  [address] [place], [place], [address] Phone: [phone]   Fax:  [phone]  Name: Douglas Burch. MRN: [id number] Date of Birth: 10/23/1940

## 2018-06-20 ENCOUNTER — Ambulatory Visit: Payer: Medicare Other | Admitting: Physical Therapy

## 2018-06-20 DIAGNOSIS — G8929 Other chronic pain: Secondary | ICD-10-CM

## 2018-06-20 DIAGNOSIS — M5441 Lumbago with sciatica, right side: Secondary | ICD-10-CM | POA: Diagnosis not present

## 2018-06-20 DIAGNOSIS — M62830 Muscle spasm of back: Secondary | ICD-10-CM

## 2018-06-20 NOTE — Therapy (Signed)
[address] [place], [place], [address] Phone: [phone]   Fax:  [phone]  Physical Therapy Treatment  Patient Details  Name: Douglas Burch. MRN: [id number] Date of Birth: 04/07/1941 Referring Provider (PT): Lavone Orn MD   Encounter Date: 06/20/2018  PT End of Session - 06/20/18 1040    Visit Number  2    Date for PT Re-Evaluation  08/17/18    PT Start Time  0845    PT Stop Time  0935    PT Time Calculation (min)  50 min    Activity Tolerance  Patient tolerated treatment well    Behavior During Therapy  [facility] for tasks assessed/performed       No past medical history on file.  No past surgical history on file.  There were no vitals filed for this visit.  Subjective Assessment - 06/20/18 0913    Subjective  Pt reports about 7/10 low back pain today with referred pain down into his Rt leg. He denies N/T but states if feels like a "charlie horse".  He also relays he is not sure if he is doing his stretches right.     Currently in Pain?  Yes    Pain Score  7     Pain Location  Back    Pain Orientation  Lower;Right    Pain Descriptors / Indicators  Aching;Cramping    Pain Type  Acute pain    Pain Radiating Towards  Rt leg down to calf                       [facility] Adult PT Treatment/Exercise - 06/20/18 0001      Exercises   Exercises  Lumbar      Lumbar Exercises: Stretches   Passive Hamstring Stretch  Right;Left;2 reps;30 seconds    Lower Trunk Rotation  --    ITB Stretch  Right;2 reps;30 seconds    Piriformis Stretch  Right;2 reps;30 seconds    Gastroc Stretch  Right;2 reps;30 seconds      Modalities   Modalities  Electrical Stimulation;Moist Heat      Moist Heat Therapy   Number Minutes Moist Heat  15 Minutes    Moist Heat Location  Lumbar Spine      Electrical Stimulation   Electrical Stimulation Location  lumbar    Electrical Stimulation Action  IFC    Electrical Stimulation Parameters  tried sitting today, 80-150 mhz to tolerance    Electrical Stimulation Goals  Pain      Manual Therapy   Manual therapy comments  STM, T.P relase to Rt lumbar P.S, QL and piriformis. Long Axis distraction/manual traction to Rt LE pt supine, 30 sec X 5 and then bilat LE 30 sec X 5. (this centralized leg symptoms and he only had pain in lower back that decreased in intensitiy)             PT Education - 06/20/18 1039    Education provided  Yes    Education Details  HEP review    Person(s) Educated  Patient    Methods  Explanation;Demonstration;Tactile cues;Verbal cues    Comprehension  Verbalized understanding;Returned demonstration       PT Short Term Goals - 06/17/18 0840      PT SHORT TERM GOAL #1   Title  independent with initial HEP    Time  2    Period  Weeks  Status  New        PT Long Term Goals - 06/17/18 0840      PT LONG TERM GOAL #1   Title  Pt will improve lumbar AROM to painfree within available motion    Time  8    Period  Weeks    Status  New      PT LONG TERM GOAL #2   Title  Pt will report being able to get out of his chair at home without difficulty    Time  8    Period  Weeks    Status  New      PT LONG TERM GOAL #3   Title  decrease pain 50%    Time  8    Period  Weeks    Status  New      PT LONG TERM GOAL #4   Title  report 50% less calf pain    Time  8    Period  Weeks    Status  New            Plan - 06/20/18 1040    Clinical Impression Statement  Session began with MHP and E-stim to decreaese pain and warm up muscles to allow for greater tissue extensibility for stretching. Pt was taken though HEP with education provided for technique. He was then able to show good return demonstration. He was then treated with MT for STM and T.P relase followed by manual traction. This centralized leg symptoms and he only had pain in lower back that decreased in intensitiy by the end of session. He had  great response to treatment today and may further benefit from mechanical traction.     PT Frequency  2x / week    PT Duration  6 weeks    PT Treatment/Interventions  Electrical Stimulation;Moist Heat;Patient/family education;Therapeutic exercise;Therapeutic activities;Neuromuscular re-education;Manual techniques;Taping;ADLs/Self Care Home Management;Traction;Cryotherapy;Dry needling    PT Next Visit Plan  start gym activites for flexibility and strength and may try traction    Consulted and Agree with Plan of Care  Patient       Patient will benefit from skilled therapeutic intervention in order to improve the following deficits and impairments:  Decreased range of motion, Difficulty walking, Increased muscle spasms, Decreased activity tolerance, Pain, Improper body mechanics, Impaired flexibility, Decreased mobility, Decreased strength, Postural dysfunction  Visit Diagnosis: Chronic bilateral low back pain with right-sided sciatica  Muscle spasm of back     Problem List There are no active problems to display for this patient.   Debbe Odea, PT, DPT 06/20/2018, 10:50 AM  [address] [place], [place], [address] Phone: [phone]   Fax:  [phone]  Name: Jaicob Dia. MRN: [id number] Date of Birth: 05/20/1941

## 2018-06-25 ENCOUNTER — Ambulatory Visit: Payer: Medicare Other | Admitting: Physical Therapy

## 2018-06-25 ENCOUNTER — Encounter: Payer: Self-pay | Admitting: Physical Therapy

## 2018-06-25 DIAGNOSIS — G8929 Other chronic pain: Secondary | ICD-10-CM

## 2018-06-25 DIAGNOSIS — M5441 Lumbago with sciatica, right side: Secondary | ICD-10-CM | POA: Diagnosis not present

## 2018-06-25 DIAGNOSIS — M62830 Muscle spasm of back: Secondary | ICD-10-CM

## 2018-06-25 DIAGNOSIS — M79604 Pain in right leg: Secondary | ICD-10-CM

## 2018-06-25 NOTE — Therapy (Signed)
[address] [place], [place], [address] Phone: [phone]   Fax:  [phone]  Physical Therapy Treatment  Patient Details  Name: Douglas Burch. MRN: [id number] Date of Birth: [birth year] Referring Provider (PT): Lavone Orn MD   Encounter Date: 06/25/2018  PT End of Session - 06/25/18 0947    Visit Number  3    Date for PT Re-Evaluation  08/17/18    PT Start Time  0852    PT Stop Time  0953    PT Time Calculation (min)  61 min    Activity Tolerance  Patient tolerated treatment well    Behavior During Therapy  [facility] for tasks assessed/performed       History reviewed. No pertinent past medical history.  History reviewed. No pertinent surgical history.  There were no vitals filed for this visit.  Subjective Assessment - 06/25/18 0854    Subjective  "Feeling good"     Currently in Pain?  Yes    Pain Score  3     Pain Location  Back   down R leg   Pain Orientation  Right                       [facility] Adult PT Treatment/Exercise - 06/25/18 0001      Exercises   Exercises  Lumbar      Lumbar Exercises: Stretches   Passive Hamstring Stretch  Right;Left;4 reps;10 seconds    Piriformis Stretch  Right;Left;4 reps;10 seconds    Other Lumbar Stretch Exercise  gastroc stretch 3x15''       Lumbar Exercises: Aerobic   UBE (Upper Arm Bike)  L3 x3 min     Recumbent Bike  L0 x4 min       Lumbar Exercises: Machines for Strengthening   Cybex Knee Extension  5lb x10     Cybex Knee Flexion  20lb x10    Other Lumbar Machine Exercise  Rows & lats 20lb 2x10      Lumbar Exercises: Seated   Sit to Stand  5 reps   x2 from mat table      Modalities   Modalities  Electrical Stimulation;Moist Heat      Moist Heat Therapy   Number Minutes Moist Heat  15 Minutes    Moist Heat Location  Lumbar Spine      Electrical Stimulation   Electrical Stimulation Location  lumbar    Electrical  Stimulation Action  IFC    Electrical Stimulation Parameters  supine    Electrical Stimulation Goals  Pain               PT Short Term Goals - 06/17/18 0840      PT SHORT TERM GOAL #1   Title  independent with initial HEP    Time  2    Period  Weeks    Status  New        PT Long Term Goals - 06/17/18 0840      PT LONG TERM GOAL #1   Title  Pt will improve lumbar AROM to painfree within available motion    Time  8    Period  Weeks    Status  New      PT LONG TERM GOAL #2   Title  Pt will report being able to get out of his chair at home without difficulty    Time  8  Period  Weeks    Status  New      PT LONG TERM GOAL #3   Title  decrease pain 50%    Time  8    Period  Weeks    Status  New      PT LONG TERM GOAL #4   Title  report 50% less calf pain    Time  8    Period  Weeks    Status  New            Plan - 06/25/18 8413    Clinical Impression Statement  Pt ~ 7 minutes late for today's treatment session. He tolerated a progression to TE well. Pt enter clinic reporting decrease pain. Postural cues needed for appropriate seated row execution. Some difficulty noted with sit to stands, cues needed to shift weight forward and to come to full standing. Tighten bilat HS with passive stretching, R piriformis was tighter than L.     PT Frequency  2x / week    PT Duration  6 weeks    PT Treatment/Interventions  Electrical Stimulation;Moist Heat;Patient/family education;Therapeutic exercise;Therapeutic activities;Neuromuscular re-education;Manual techniques;Taping;ADLs/Self Care Home Management;Traction;Cryotherapy;Dry needling    PT Next Visit Plan  start gym activites for flexibility and strength and may try traction       Patient will benefit from skilled therapeutic intervention in order to improve the following deficits and impairments:  Decreased range of motion, Difficulty walking, Increased muscle spasms, Decreased activity tolerance, Pain, Improper  body mechanics, Impaired flexibility, Decreased mobility, Decreased strength, Postural dysfunction  Visit Diagnosis: Chronic bilateral low back pain with right-sided sciatica  Muscle spasm of back  Pain in right leg     Problem List There are no active problems to display for this patient.   Scot Jun, PTA 06/25/2018, 9:58 AM  [address] [place], [place], [address] Phone: [phone]   Fax:  [phone]  Name: Douglas Burch. MRN: [id number] Date of Birth: 10-Jul-1941

## 2018-07-09 ENCOUNTER — Encounter: Payer: Medicare Other | Admitting: Physical Therapy

## 2018-07-16 ENCOUNTER — Encounter: Payer: Self-pay | Admitting: Physical Therapy

## 2018-07-16 ENCOUNTER — Ambulatory Visit: Payer: Medicare Other | Attending: Internal Medicine | Admitting: Physical Therapy

## 2018-07-16 DIAGNOSIS — M62830 Muscle spasm of back: Secondary | ICD-10-CM | POA: Diagnosis present

## 2018-07-16 DIAGNOSIS — G8929 Other chronic pain: Secondary | ICD-10-CM

## 2018-07-16 DIAGNOSIS — M79605 Pain in left leg: Secondary | ICD-10-CM | POA: Diagnosis present

## 2018-07-16 DIAGNOSIS — M5441 Lumbago with sciatica, right side: Secondary | ICD-10-CM | POA: Diagnosis present

## 2018-07-16 NOTE — Therapy (Signed)
[facility] [address] [place], [place], [address] Phone: [phone]   Fax:  [phone]  Physical Therapy Treatment  Patient Details  Name: Douglas Burch. MRN: [id number] Date of Birth: 1941/04/29 Referring Provider (PT): Lavone Orn MD   Encounter Date: 07/16/2018  PT End of Session - 07/16/18 1344    Visit Number  4    Date for PT Re-Evaluation  08/17/18    PT Start Time  1301    PT Stop Time  1400    PT Time Calculation (min)  59 min    Activity Tolerance  Patient tolerated treatment well    Behavior During Therapy  [facility] for tasks assessed/performed       History reviewed. No pertinent past medical history.  History reviewed. No pertinent surgical history.  There were no vitals filed for this visit.  Subjective Assessment - 07/16/18 1304    Subjective  Pt reports that he has been having do pain in her distal R shaft. "Not as bad today"    Currently in Pain?  Yes    Pain Score  3     Pain Location  Calf    Pain Orientation  Right         [facility] PT Assessment - 07/16/18 0001      AROM   Lumbar Extension  decreased 50%     Lumbar - Right Side Bend  WFL    Lumbar - Left Side Bend  WFL    Lumbar - Right Rotation  WFL    Lumbar - Left Rotation  [facility]                   [facility] Adult PT Treatment/Exercise - 07/16/18 0001      Exercises   Exercises  Lumbar      Lumbar Exercises: Stretches   Passive Hamstring Stretch  Right;Left;4 reps;10 seconds    Piriformis Stretch  Right;Left;4 reps;10 seconds    Other Lumbar Stretch Exercise  gastroc stretch 3x15''       Lumbar Exercises: Aerobic   Nustep  L4 x 6 min       Lumbar Exercises: Machines for Strengthening   Leg Press  30lb 2x10     Other Lumbar Machine Exercise  Rows & lats 25lb 2x10      Lumbar Exercises: Seated   Sit to Stand  10 reps      Lumbar Exercises: Supine   Bridge  Compliant;20 reps;2 seconds      Modalities   Modalities  Electrical Stimulation;Moist Heat      Moist Heat Therapy   Number Minutes Moist Heat  15 Minutes    Moist Heat Location  Lumbar Spine      Electrical Stimulation   Electrical Stimulation Location  lumbar    Electrical Stimulation Action  IFC    Electrical Stimulation Parameters  supine    Electrical Stimulation Goals  Pain               PT Short Term Goals - 06/17/18 0840      PT SHORT TERM GOAL #1   Title  independent with initial HEP    Time  2    Period  Weeks    Status  New        PT Long Term Goals - 07/16/18 1316      PT LONG TERM GOAL #1   Title  Pt will improve lumbar AROM to  painfree within available motion    Status  Partially Met      PT LONG TERM GOAL #2   Title  Pt will report being able to get out of his chair at home without difficulty    Status  Partially Met      PT LONG TERM GOAL #3   Title  decrease pain 50%    Status  Partially Met            Plan - 07/16/18 1345    Clinical Impression Statement  Pt return to PT after a trip, he reports some issues with pain in his L leg, but it was not bad today. Cues to shift wt forward with sit to stand, he will occasionally stand and fall back but to not shift wt forward. He did well with machine exercises. Very tight calves noted during stretching.     PT Frequency  2x / week    PT Duration  6 weeks    PT Treatment/Interventions  Electrical Stimulation;Moist Heat;Patient/family education;Therapeutic exercise;Therapeutic activities;Neuromuscular re-education;Manual techniques;Taping;ADLs/Self Care Home Management;Traction;Cryotherapy;Dry needling    PT Next Visit Plan  gym activities for flexibility and strength and may try traction       Patient will benefit from skilled therapeutic intervention in order to improve the following deficits and impairments:  Decreased range of motion, Difficulty walking, Increased muscle spasms, Decreased activity tolerance, Pain, Improper body  mechanics, Impaired flexibility, Decreased mobility, Decreased strength, Postural dysfunction  Visit Diagnosis: Chronic bilateral low back pain with right-sided sciatica  Muscle spasm of back  Pain in left leg     Problem List There are no active problems to display for this patient.   Douglas Burch, PTA 07/16/2018, 1:52 PM  [address] [place], [place], [address] Phone: [phone]   Fax:  [phone]  Name: Douglas Burch. MRN: [id number] Date of Birth: [birth year]

## 2018-07-22 ENCOUNTER — Encounter: Payer: Self-pay | Admitting: Physical Therapy

## 2018-07-22 ENCOUNTER — Ambulatory Visit: Payer: Medicare Other | Admitting: Physical Therapy

## 2018-07-22 DIAGNOSIS — M79605 Pain in left leg: Secondary | ICD-10-CM

## 2018-07-22 DIAGNOSIS — M62830 Muscle spasm of back: Secondary | ICD-10-CM

## 2018-07-22 DIAGNOSIS — M5441 Lumbago with sciatica, right side: Secondary | ICD-10-CM | POA: Diagnosis not present

## 2018-07-22 DIAGNOSIS — G8929 Other chronic pain: Secondary | ICD-10-CM

## 2018-07-22 NOTE — Therapy (Signed)
[address] [place], [place], [address] Phone: [phone]   Fax:  [phone]  Physical Therapy Treatment  Patient Details  Name: Douglas Burch. MRN: [id number] Date of Birth: 12/25/1940 Referring Provider (PT): Lavone Orn MD   Encounter Date: 07/22/2018  PT End of Session - 07/22/18 0926    Visit Number  5    Date for PT Re-Evaluation  08/17/18    PT Start Time  0845    PT Stop Time  0936    PT Time Calculation (min)  51 min    Activity Tolerance  Patient tolerated treatment well    Behavior During Therapy  [facility] for tasks assessed/performed       History reviewed. No pertinent past medical history.  History reviewed. No pertinent surgical history.  There were no vitals filed for this visit.  Subjective Assessment - 07/22/18 0850    Subjective  Pt stated that he is having less leg pain since last treatment.     Currently in Pain?  Yes    Pain Score  3     Pain Location  Calf    Pain Orientation  Right;Lateral                       [facility] Adult PT Treatment/Exercise - 07/22/18 0001      Exercises   Exercises  Lumbar      Lumbar Exercises: Stretches   Piriformis Stretch  Right;4 reps;10 seconds;20 seconds      Lumbar Exercises: Aerobic   Recumbent Bike  L1 x 4 min     Nustep  L4 x 6 min       Lumbar Exercises: Machines for Strengthening   Leg Press  40lb 2x15, 40lb heel raises 2x15     Other Lumbar Machine Exercise  Rows & lats 25lb 2x15      Lumbar Exercises: Standing   Other Standing Lumbar Exercises  Sit to stand form blue chair. x10   cues tokeep weith over toes   Other Standing Lumbar Exercises  6 in step up x10 each    Cues to lean over onto driving LE.     Moist Heat Therapy   Number Minutes Moist Heat  10 Minutes    Moist Heat Location  Lumbar Spine               PT Short Term Goals - 07/22/18 0946      PT SHORT TERM GOAL #1   Title   independent with initial HEP    Status  Achieved        PT Long Term Goals - 07/22/18 0901      PT LONG TERM GOAL #4   Title  report 50% less calf pain    Status  Partially Met            Plan - 07/22/18 0926    Clinical Impression Statement  Pt reports less pain in her R calf since last treatment. Increase workload on both LE in today's session. No increase in pain reports overall. Pt tens to have a posterior lean with functional interventions, losing his balance at times.     PT Frequency  2x / week    PT Duration  6 weeks    PT Treatment/Interventions  Electrical Stimulation;Moist Heat;Patient/family education;Therapeutic exercise;Therapeutic activities;Neuromuscular re-education;Manual techniques;Taping;ADLs/Self Care Home Management;Traction;Cryotherapy;Dry needling    PT Next Visit Plan  gym activities for  flexibility and strength and may try traction, functional interventions       Patient will benefit from skilled therapeutic intervention in order to improve the following deficits and impairments:  Decreased range of motion, Difficulty walking, Increased muscle spasms, Decreased activity tolerance, Pain, Improper body mechanics, Impaired flexibility, Decreased mobility, Decreased strength, Postural dysfunction  Visit Diagnosis: Chronic bilateral low back pain with right-sided sciatica  Muscle spasm of back  Pain in left leg     Problem List There are no active problems to display for this patient.   Scot Jun, PTA 07/22/2018, 9:46 AM  [address] [place], [place], [address] Phone: [phone]   Fax:  [phone]  Name: Douglas Burch. MRN: [id number] Date of Birth: November 03, 1940

## 2018-08-06 ENCOUNTER — Encounter: Payer: Self-pay | Admitting: Physical Therapy

## 2018-08-06 ENCOUNTER — Ambulatory Visit: Payer: Medicare Other | Attending: Internal Medicine | Admitting: Physical Therapy

## 2018-08-06 DIAGNOSIS — M5441 Lumbago with sciatica, right side: Secondary | ICD-10-CM | POA: Diagnosis present

## 2018-08-06 DIAGNOSIS — M79605 Pain in left leg: Secondary | ICD-10-CM

## 2018-08-06 DIAGNOSIS — G8929 Other chronic pain: Secondary | ICD-10-CM | POA: Diagnosis present

## 2018-08-06 DIAGNOSIS — M62830 Muscle spasm of back: Secondary | ICD-10-CM | POA: Diagnosis not present

## 2018-08-06 DIAGNOSIS — M545 Low back pain: Secondary | ICD-10-CM | POA: Insufficient documentation

## 2018-08-06 DIAGNOSIS — M79604 Pain in right leg: Secondary | ICD-10-CM | POA: Insufficient documentation

## 2018-08-06 NOTE — Therapy (Signed)
[address] [place], [place], [address] Phone: [phone]   Fax:  [phone]  Physical Therapy Treatment  Patient Details  Name: Douglas Burch. MRN: [id number] Date of Birth: [birth year] Referring Provider (PT): Lavone Orn MD   Encounter Date: 08/06/2018  PT End of Session - 08/06/18 0928    Visit Number  6    Date for PT Re-Evaluation  08/17/18    PT Start Time  0850    PT Stop Time  0940    PT Time Calculation (min)  50 min    Activity Tolerance  Patient tolerated treatment well    Behavior During Therapy  [facility] for tasks assessed/performed       History reviewed. No pertinent past medical history.  History reviewed. No pertinent surgical history.  There were no vitals filed for this visit.  Subjective Assessment - 08/06/18 0856    Subjective  "Leg are a lot better than It was, back is tolerable"    Diagnostic tests  xray of lumbar spine showing 6 lumbar levels with DDD, mild OA bil hips and knees    Currently in Pain?  No/denies    Pain Score  0-No pain                       [facility] Adult PT Treatment/Exercise - 08/06/18 0001      Exercises   Exercises  Lumbar      Lumbar Exercises: Aerobic   Elliptical  3 min R 3 I 5    UBE (Upper Arm Bike)  L3 x4 min       Lumbar Exercises: Machines for Strengthening   Leg Press  40lb 2x15, 40lb heel raises 2x15     Other Lumbar Machine Exercise  Rows & lats 35lb 2x10      Lumbar Exercises: Standing   Other Standing Lumbar Exercises  Sit to stand form blue chair. x10   cues to keep wt over toes   Other Standing Lumbar Exercises  Pulley ext 5lb 2x10       Moist Heat Therapy   Number Minutes Moist Heat  10 Minutes    Moist Heat Location  Lumbar Spine               PT Short Term Goals - 07/22/18 0946      PT SHORT TERM GOAL #1   Title  independent with initial HEP    Status  Achieved        PT Long Term Goals  - 08/06/18 3888      PT LONG TERM GOAL #1   Title  Pt will improve lumbar AROM to painfree within available motion    Status  Partially Met      PT LONG TERM GOAL #2   Title  Pt will report being able to get out of his chair at home without difficulty    Status  Partially Met      PT LONG TERM GOAL #3   Title  decrease pain 50%    Status  Achieved      PT LONG TERM GOAL #4   Title  report 50% less calf pain    Status  Partially Met            Plan - 08/06/18 0929    Clinical Impression Statement  Pt continues to reports less pain in his legs. No pain reported throughout session. Increase  weight tolerated with rows and lats. Cues needed during sit to stand to keep weight over toes. Pt has forward posture with decrease lumbar lordosis.    PT Frequency  2x / week    PT Duration  6 weeks    PT Next Visit Plan  gym activites for flexibility and strength and may try traction, functional interventions       Patient will benefit from skilled therapeutic intervention in order to improve the following deficits and impairments:  Decreased range of motion, Difficulty walking, Increased muscle spasms, Decreased activity tolerance, Pain, Improper body mechanics, Impaired flexibility, Decreased mobility, Decreased strength, Postural dysfunction  Visit Diagnosis: Muscle spasm of back  Chronic bilateral low back pain with right-sided sciatica  Pain in left leg  Pain in right leg     Problem List There are no active problems to display for this patient.   Scot Jun, PTA 08/06/2018, 9:30 AM  [address] [place], [place], [address] Phone: [phone]   Fax:  [phone]  Name: Douglas Burch. MRN: [id number] Date of Birth: [birth year]

## 2018-08-09 ENCOUNTER — Ambulatory Visit: Payer: Medicare Other | Admitting: Physical Therapy

## 2018-08-09 ENCOUNTER — Encounter: Payer: Self-pay | Admitting: Physical Therapy

## 2018-08-09 DIAGNOSIS — M62830 Muscle spasm of back: Secondary | ICD-10-CM

## 2018-08-09 DIAGNOSIS — M79605 Pain in left leg: Secondary | ICD-10-CM

## 2018-08-09 DIAGNOSIS — G8929 Other chronic pain: Secondary | ICD-10-CM

## 2018-08-09 DIAGNOSIS — M5441 Lumbago with sciatica, right side: Secondary | ICD-10-CM

## 2018-08-09 NOTE — Therapy (Signed)
[address] [place], [place], [address] Phone: [phone]   Fax:  [phone]  Physical Therapy Treatment  Patient Details  Name: Douglas Burch. MRN: [id number] Date of Birth: [birth year] Referring Provider (PT): Lavone Orn MD   Encounter Date: 08/09/2018  PT End of Session - 08/09/18 1007    Visit Number  7    Date for PT Re-Evaluation  08/17/18    PT Start Time  0930    PT Stop Time  1024    PT Time Calculation (min)  54 min    Activity Tolerance  Patient tolerated treatment well    Behavior During Therapy  [facility] for tasks assessed/performed       History reviewed. No pertinent past medical history.  History reviewed. No pertinent surgical history.  There were no vitals filed for this visit.  Subjective Assessment - 08/09/18 0934    Subjective  Pt reports that he may did too much having some low back pain that went down R leg.     Currently in Pain?  Yes    Pain Score  5     Pain Location  Leg    Pain Orientation  Right                       [facility] Adult PT Treatment/Exercise - 08/09/18 0001      Exercises   Exercises  Lumbar      Lumbar Exercises: Stretches   Single Knee to Chest Stretch  Left;Right;4 reps;10 seconds;20 seconds      Lumbar Exercises: Aerobic   UBE (Upper Arm Bike)  L3 x6 min       Lumbar Exercises: Machines for Strengthening   Other Lumbar Machine Exercise  Rows & lats 35lb 2x10      Lumbar Exercises: Standing   Other Standing Lumbar Exercises  Pulley ext 5lb 2x10       Lumbar Exercises: Supine   Bridge  Compliant;20 reps;2 seconds    Other Supine Lumbar Exercises  LE on on pball bridges, K2C, and oblk      Modalities   Modalities  Electrical Stimulation;Moist Heat      Moist Heat Therapy   Number Minutes Moist Heat  15 Minutes    Moist Heat Location  Lumbar Spine      Electrical Stimulation   Electrical Stimulation Location  lumbar     Electrical Stimulation Action  IFC    Electrical Stimulation Parameters  supine    Electrical Stimulation Goals  Pain               PT Short Term Goals - 07/22/18 0946      PT SHORT TERM GOAL #1   Title  independent with initial HEP    Status  Achieved        PT Long Term Goals - 08/06/18 8416      PT LONG TERM GOAL #1   Title  Pt will improve lumbar AROM to painfree within available motion    Status  Partially Met      PT LONG TERM GOAL #2   Title  Pt will report being able to get out of his chair at home without difficulty    Status  Partially Met      PT LONG TERM GOAL #3   Title  decrease pain 50%    Status  Achieved      PT LONG  TERM GOAL #4   Title  report 50% less calf pain    Status  Partially Met            Plan - 08/09/18 1014    Clinical Impression Statement  pt reports increase pain after last session so intervention scaled back. Pt had reported pain that went down R leg. Pt reports a decrease in pain after today's treatment before modality. Cues needed with standing shoulder extension in regards to maintain correct posture. Good strength with rows and extensions.  No issues with supine interventions, little motion with bridges.    PT Frequency  2x / week    PT Duration  6 weeks    PT Treatment/Interventions  Electrical Stimulation;Moist Heat;Patient/family education;Therapeutic exercise;Therapeutic activities;Neuromuscular re-education;Manual techniques;Taping;ADLs/Self Care Home Management;Traction;Cryotherapy;Dry needling    PT Next Visit Plan  gym activities for flexibility and strength and may try traction, functional interventions       Patient will benefit from skilled therapeutic intervention in order to improve the following deficits and impairments:  Decreased range of motion, Difficulty walking, Increased muscle spasms, Decreased activity tolerance, Pain, Improper body mechanics, Impaired flexibility, Decreased mobility, Decreased  strength, Postural dysfunction  Visit Diagnosis: Muscle spasm of back  Chronic bilateral low back pain with right-sided sciatica  Pain in left leg     Problem List There are no active problems to display for this patient.   Scot Jun, PTA 08/09/2018, 10:17 AM  [address] [place], [place], [address] Phone: [phone]   Fax:  [phone]  Name: Douglas Burch. MRN: [id number] Date of Birth: 05-Oct-1940

## 2018-08-14 ENCOUNTER — Encounter: Payer: Self-pay | Admitting: Physical Therapy

## 2018-08-14 ENCOUNTER — Ambulatory Visit: Payer: Medicare Other | Admitting: Physical Therapy

## 2018-08-14 DIAGNOSIS — M62830 Muscle spasm of back: Secondary | ICD-10-CM | POA: Diagnosis not present

## 2018-08-14 DIAGNOSIS — M79605 Pain in left leg: Secondary | ICD-10-CM

## 2018-08-14 DIAGNOSIS — G8929 Other chronic pain: Secondary | ICD-10-CM

## 2018-08-14 DIAGNOSIS — M5441 Lumbago with sciatica, right side: Principal | ICD-10-CM

## 2018-08-14 NOTE — Therapy (Signed)
[address] [place], [place], [address] Phone: [phone]   Fax:  [phone]  Physical Therapy Treatment  Patient Details  Name: Douglas Burch. MRN: [id number] Date of Birth: [birth year] Referring Provider (PT): Lavone Orn MD   Encounter Date: 08/14/2018  PT End of Session - 08/14/18 1004    Visit Number  8    Date for PT Re-Evaluation  08/17/18    PT Start Time  0930    PT Stop Time  1010    PT Time Calculation (min)  40 min    Activity Tolerance  Patient tolerated treatment well    Behavior During Therapy  [facility] for tasks assessed/performed       History reviewed. No pertinent past medical history.  History reviewed. No pertinent surgical history.  There were no vitals filed for this visit.  Subjective Assessment - 08/14/18 0933    Subjective  "All right" "Very little, a spot on my back" "Calves will hurt after the third walk with the dog"    Currently in Pain?  No/denies    Pain Score  0-No pain                       [facility] Adult PT Treatment/Exercise - 08/14/18 0001      Lumbar Exercises: Aerobic   UBE (Upper Arm Bike)  L3 x 4 min     Recumbent Bike  L1 x 5 min       Lumbar Exercises: Machines for Strengthening   Cybex Knee Extension  10lb 2x15     Cybex Knee Flexion  25lb 2x15     Leg Press  40lb 2x15    Other Lumbar Machine Exercise  Rows & lats 35lb 2x10      Lumbar Exercises: Standing   Other Standing Lumbar Exercises  Tricep ext 25lb 2x15     Other Standing Lumbar Exercises  Pulley ext 5lb 2x10                PT Short Term Goals - 07/22/18 0946      PT SHORT TERM GOAL #1   Title  independent with initial HEP    Status  Achieved        PT Long Term Goals - 08/14/18 1007      PT LONG TERM GOAL #1   Title  Pt will improve lumbar AROM to painfree within available motion    Status  Achieved      PT LONG TERM GOAL #2   Title  Pt will  report being able to get out of his chair at home without difficulty    Status  Partially Met      PT LONG TERM GOAL #3   Title  decrease pain 50%    Status  Achieved      PT LONG TERM GOAL #4   Title  report 50% less calf pain    Status  Achieved            Plan - 08/14/18 1005    Clinical Impression Statement  Pt reports that he felt good after last session and overall, denied modality post treatment. All exercises completed well without adverse effect. Good strength and ROM on leg press. Leg press was set at a lower position with knee flexion greater than 90    PT Frequency  2x / week    PT Duration  6 weeks  PT Treatment/Interventions  Occupational hygienist Heat;Patient/family education;Therapeutic exercise;Therapeutic activities;Neuromuscular re-education;Manual techniques;Taping;ADLs/Self Care Home Management;Traction;Cryotherapy;Dry needling    PT Next Visit Plan  gym activites for flexibility and strength and may try traction, functional interventions       Patient will benefit from skilled therapeutic intervention in order to improve the following deficits and impairments:  Decreased range of motion, Difficulty walking, Increased muscle spasms, Decreased activity tolerance, Pain, Improper body mechanics, Impaired flexibility, Decreased mobility, Decreased strength, Postural dysfunction  Visit Diagnosis: Chronic bilateral low back pain with right-sided sciatica  Muscle spasm of back  Pain in left leg     Problem List There are no active problems to display for this patient.   Scot Jun, PTA 08/14/2018, 10:07 AM  [address] [place], [place], [address] Phone: [phone]   Fax:  [phone]  Name: Douglas Burch. MRN: [id number] Date of Birth: [birth year]

## 2018-08-21 ENCOUNTER — Encounter: Payer: Self-pay | Admitting: Physical Therapy

## 2018-08-21 ENCOUNTER — Ambulatory Visit: Payer: Medicare Other | Admitting: Physical Therapy

## 2018-08-21 DIAGNOSIS — M5441 Lumbago with sciatica, right side: Principal | ICD-10-CM

## 2018-08-21 DIAGNOSIS — M79604 Pain in right leg: Secondary | ICD-10-CM

## 2018-08-21 DIAGNOSIS — G8929 Other chronic pain: Secondary | ICD-10-CM

## 2018-08-21 DIAGNOSIS — M545 Low back pain: Secondary | ICD-10-CM

## 2018-08-21 DIAGNOSIS — M62830 Muscle spasm of back: Secondary | ICD-10-CM

## 2018-08-21 DIAGNOSIS — M79605 Pain in left leg: Secondary | ICD-10-CM

## 2018-08-21 NOTE — Therapy (Signed)
[address] [place], [place], [address] Phone: [phone]   Fax:  [phone]  Physical Therapy Treatment  Patient Details  Name: Douglas Burch. MRN: [id number] Date of Birth: [birth year] Referring Provider (PT): Lavone Orn MD   Encounter Date: 08/21/2018  PT End of Session - 08/21/18 1010    Visit Number  9    Date for PT Re-Evaluation  08/17/18    PT Start Time  0930    PT Stop Time  1011    PT Time Calculation (min)  41 min    Activity Tolerance  Patient tolerated treatment well    Behavior During Therapy  [facility] for tasks assessed/performed       History reviewed. No pertinent past medical history.  History reviewed. No pertinent surgical history.  There were no vitals filed for this visit.  Subjective Assessment - 08/21/18 0934    Subjective  Pt stated that he is feeling well, went on a walk this morning and had some R calf tightness. Got home and rested then it eased up     Currently in Pain?  No/denies    Pain Score  0-No pain                       [facility] Adult PT Treatment/Exercise - 08/21/18 0001      Lumbar Exercises: Aerobic   UBE (Upper Arm Bike)  L3 x 4 min     Recumbent Bike  L1 x 4 min     Nustep  L4 x 4 min       Lumbar Exercises: Machines for Strengthening   Cybex Knee Extension  10lb 2x10    Cybex Knee Flexion  25lb 2x15     Leg Press  40lb 2x15, Heel raises 2x15     Other Lumbar Machine Exercise  Rows & lats 35lb 2x10               PT Short Term Goals - 07/22/18 0946      PT SHORT TERM GOAL #1   Title  independent with initial HEP    Status  Achieved        PT Long Term Goals - 08/21/18 1010      PT LONG TERM GOAL #1   Title  Pt will improve lumbar AROM to painfree within available motion    Status  Achieved      PT LONG TERM GOAL #2   Title  Pt will report being able to get out of his chair at home without difficulty    Status   Partially Met      PT LONG TERM GOAL #3   Title  decrease pain 50%    Status  Achieved            Plan - 08/21/18 1011    Clinical Impression Statement  Most goals met, pt reports no functional limitations at home. He is pleased with his current functional status.     PT Frequency  2x / week    PT Duration  6 weeks    PT Next Visit Plan  D/C PT       Patient will benefit from skilled therapeutic intervention in order to improve the following deficits and impairments:  Decreased range of motion, Difficulty walking, Increased muscle spasms, Decreased activity tolerance, Pain, Improper body mechanics, Impaired flexibility, Decreased mobility, Decreased strength, Postural dysfunction  Visit Diagnosis: Chronic  bilateral low back pain with right-sided sciatica  Muscle spasm of back  Pain in left leg  Pain in right leg  Chronic midline low back pain without sciatica     Problem List There are no active problems to display for this patient.  PHYSICAL THERAPY DISCHARGE SUMMARY  Visits from Start of Care: 9 Plan: Patient agrees to discharge.  Patient goals were partially met. Patient is being discharged due to being pleased with the current functional level.  ?????       Scot Jun, PTA 08/21/2018, 10:11 AM  [address] [place], [place], [address] Phone: [phone]   Fax:  [phone]  Name: Douglas Burch. MRN: [id number] Date of Birth: Feb 06, 1941

## 2019-03-07 IMAGING — MR MR HEAD WO/W CM
10 of 13 series · 30 of 48 positions shown · IV contrast (14cc multihance)
Comparison: Brain MRI 08/15/2012. Neck CT 09/11/2011. [facility] brain MRI 02/11/2003.

CLINICAL DATA: 75-year-old male with chronic left side tinnitus and
hearing loss with recent progression. Bilateral sensory hearing
loss.

Creatinine was obtained on site at [HOSPITAL] at [HOSPITAL].
Results: Creatinine 1.1 mg/dL.
EXAM:
MRI HEAD WITHOUT AND WITH CONTRAST
MRA HEAD WITHOUT CONTRAST
TECHNIQUE: Multiplanar, multiecho pulse sequences of the brain and surrounding
structures were obtained without and with intravenous contrast.
Angiographic images of the head were obtained using MRA technique
without contrast.
CONTRAST:  20mL MULTIHANCE GADOBENATE DIMEGLUMINE 529 MG/ML IV SOLN

[Series 4: tof_3d_multi-slab · axial · 0.7mm · 0.35mm/px · z∈[-21,+80]mm · 8 of 152 slices shown]
[im 1/152]
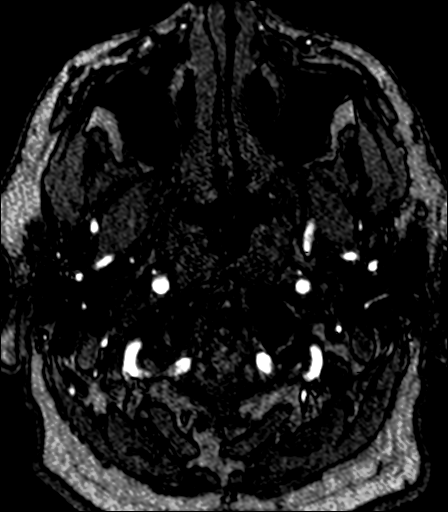
[im 31/152]
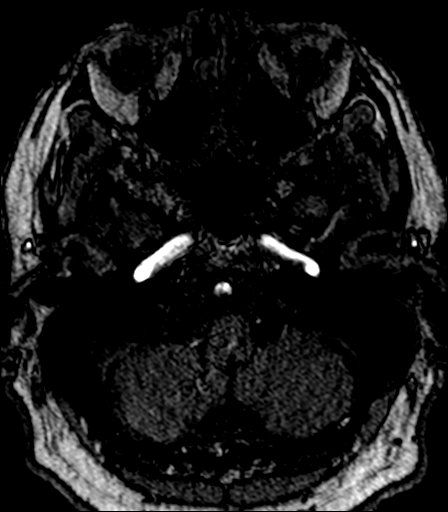
[im 46/152]
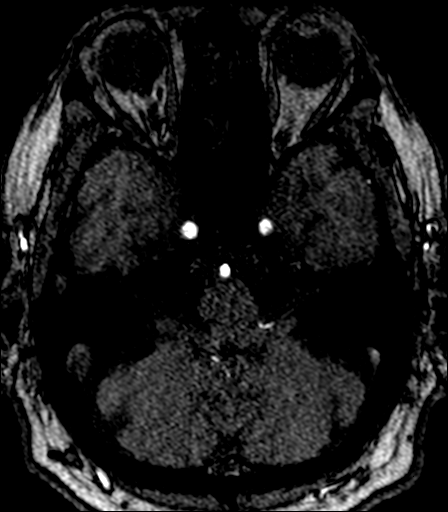
[im 61/152]
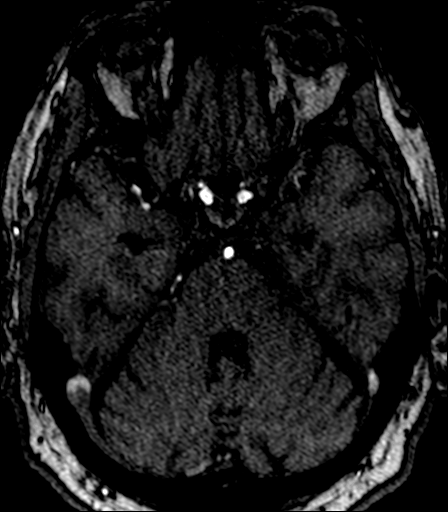
[im 91/152]
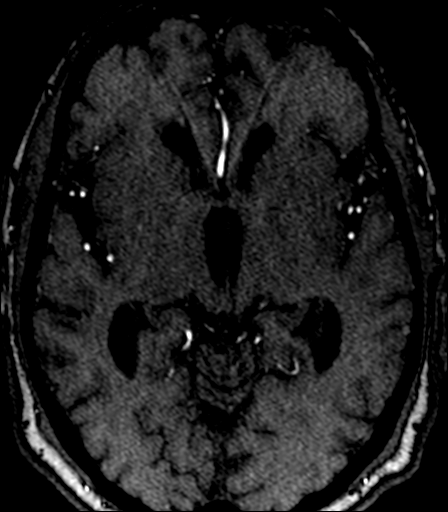
[im 106/152]
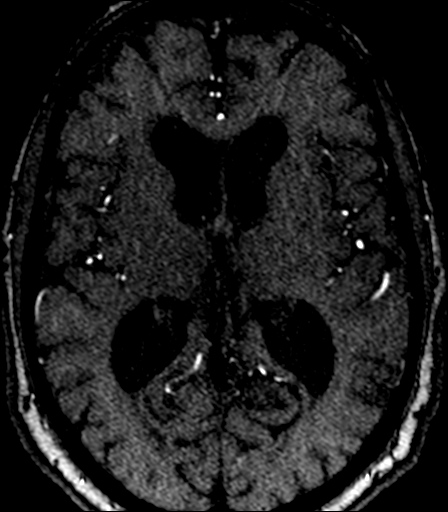
[im 121/152]
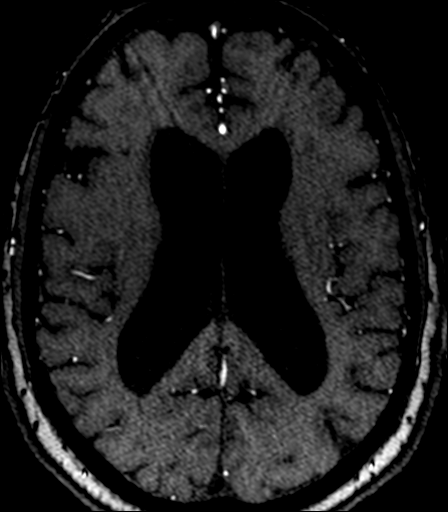
[im 152/152]
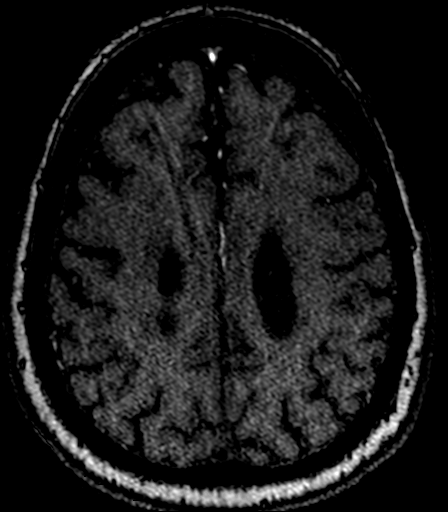

[Series 12: DWI · axial · 3.0mm · 1.80mm/px · z∈[-31,+108]mm · 8 of 96 slices shown (1 of 2)]
[im 1/96]
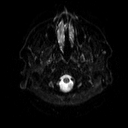
[im 14/96]
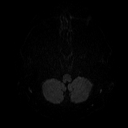
[im 28/96]
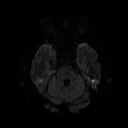
[im 41/96]
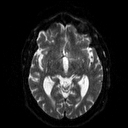
[im 55/96]
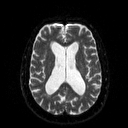
[im 68/96]
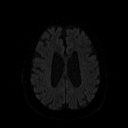
[im 82/96]
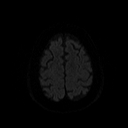
[im 96/96]
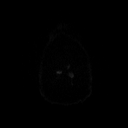

[Series 13: DWI · axial · 3.0mm · 1.80mm/px · z∈[-31,+108]mm · 4 of 47 slices shown (2 of 2)]
[im 1/47]
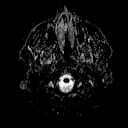
[im 16/47]
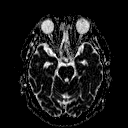
[im 31/47]
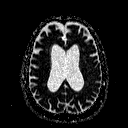
[im 47/47]
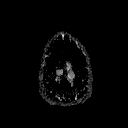

[Series 14: T2 · axial · 5.0mm · 0.60mm/px · z∈[-36,+105]mm · 2 of 23 slices shown]
[im 1/23]
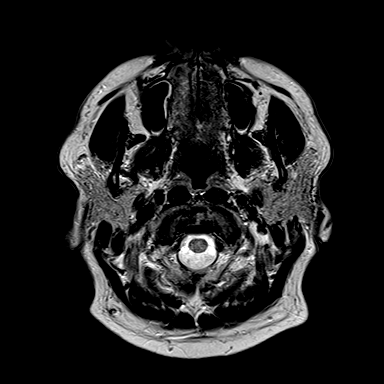
[im 23/23]
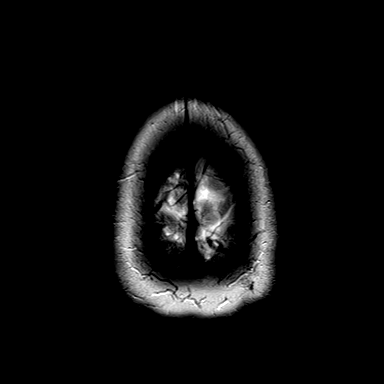

[Series 15: T1 · sagittal · 5.0mm · 0.45mm/px · 2 of 21 slices shown (1 of 3)]
[im 1/21]
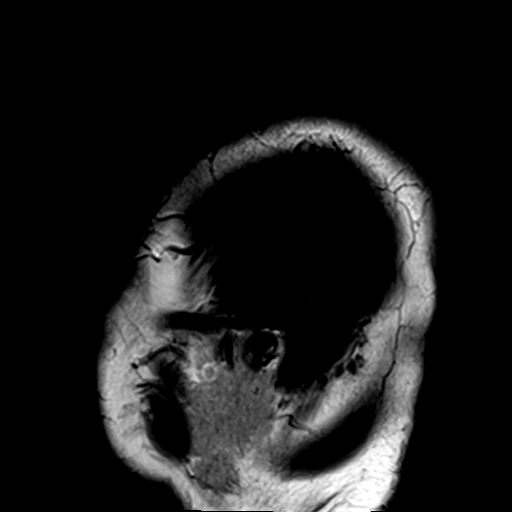
[im 21/21]
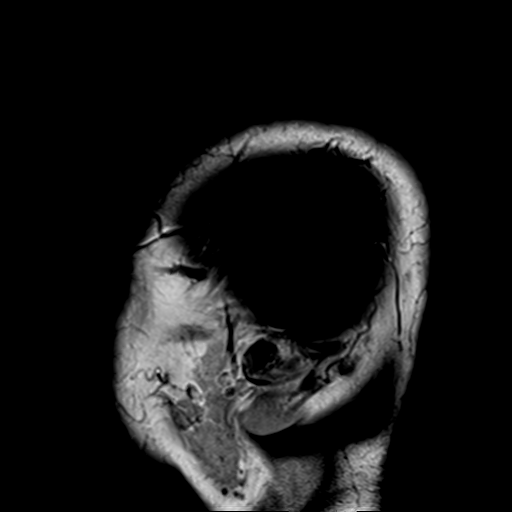

[Series 16: T1 · coronal · 3.0mm · 0.35mm/px · 1 of 11 slices shown (2 of 3)]
[im 1/11]
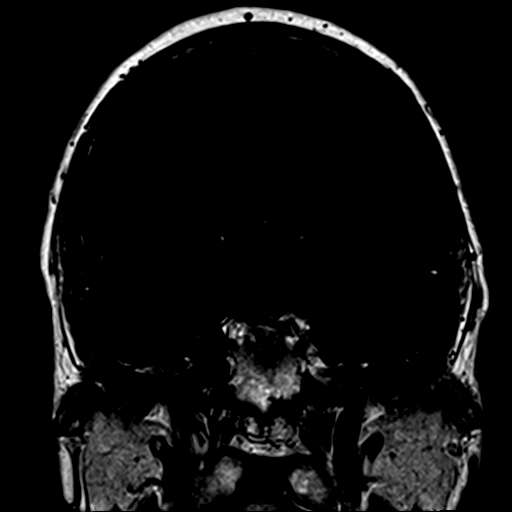

[Series 17: FLAIR · axial · 3.0mm · 0.45mm/px · z∈[-37,+105]mm · 2 of 30 slices shown]
[im 1/30]
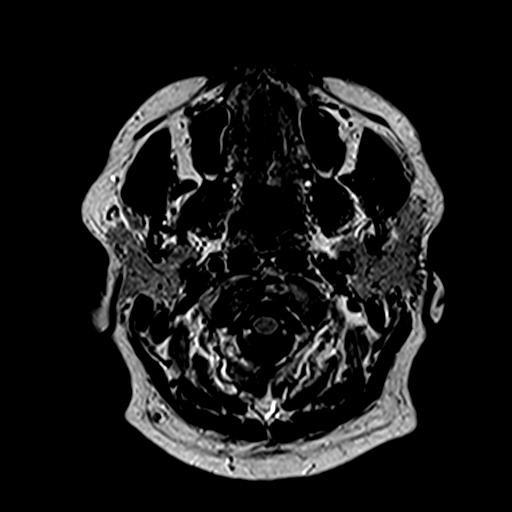
[im 30/30]
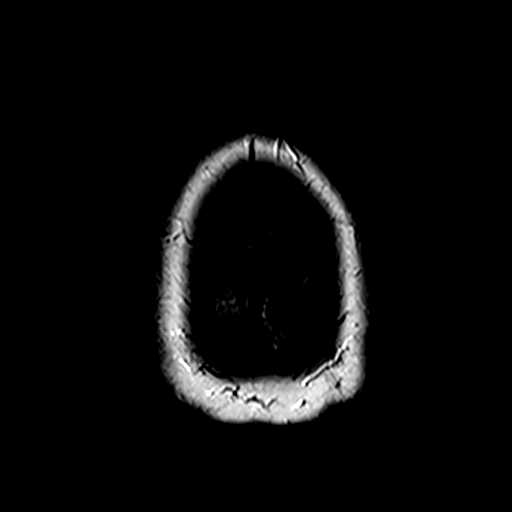

[Series 20: T1 · axial · 3.0mm · 0.35mm/px · 1 of 11 slices shown (3 of 3)]
[im 1/11]
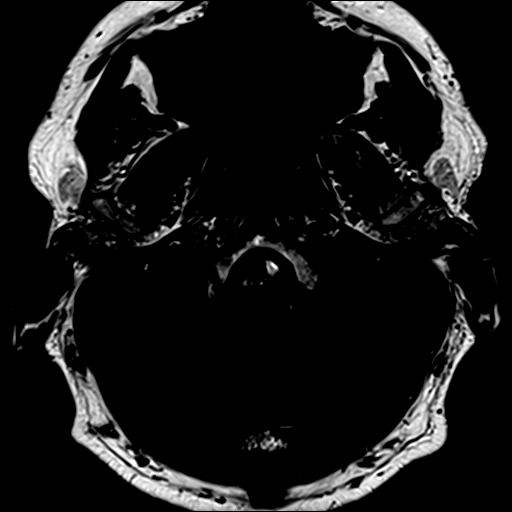

[Series 22: T1 post-contrast · coronal · 3.0mm · 0.35mm/px · 1 of 11 slices shown (1 of 2)]
[im 1/11]
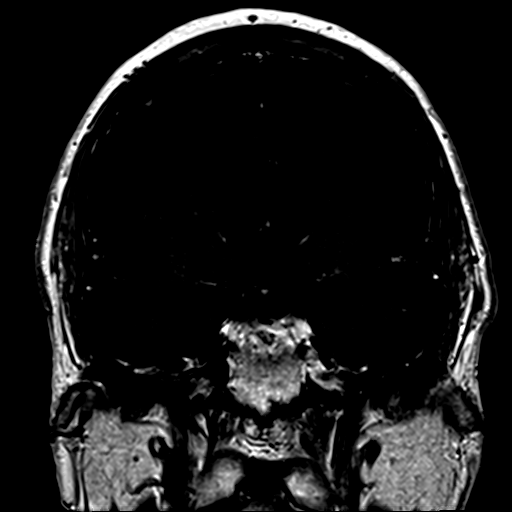

[Series 23: T1 post-contrast · axial · 3.0mm · 0.35mm/px · 1 of 11 slices shown (2 of 2)]
[im 1/11]
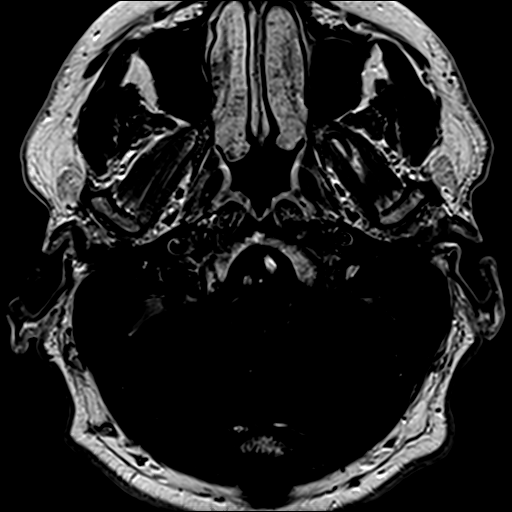

[30 of 48 positions shown; findings below may reference images not displayed]

FINDINGS: MRI HEAD FINDINGS

Brain: Mildly increase generalized cerebral volume loss since 8914.
Ex vacuo appearing enlargement of the lateral ventricles. No
restricted diffusion to suggest acute infarction. No midline shift,
mass effect, evidence of mass lesion, ventriculomegaly, extra-axial
collection or acute intracranial hemorrhage. Cervicomedullary
junction and pituitary are within normal limits.

Mildly increased scattered and patchy cerebral white matter T2 and
FLAIR hyperintensity since 8914. The configuration is nonspecific.
The extent is moderate for age. No cortical encephalomalacia. No
chronic cerebral blood products identified. Deep gray matter nuclei,
brainstem, and cerebellum remain normal for age. No abnormal
enhancement identified. No dural thickening.

Vascular: Major intracranial vascular flow voids are stable since
8914. There is generalized mild intracranial artery dolichoectasia.

Skull and upper cervical spine: Negative. Normal bone marrow signal.

Sinuses/Orbits: Stable since 8914 and negative. Negative scalp soft
tissues.

Other: Dedicated internal auditory canal imaging. Normal
cerebellopontine angles. Normal bilateral cisternal and
intracanalicular 7th and 8th cranial nerve segments. Preserved T2
signal in the bilateral cochlea and vestibular structures. The left
vestibular aqueduct is asymmetrically enlarged up to 1.5-2 mm
diameter (series 21 images 10 through 15, but there is no associated
soft tissue mass or enhancement. This aqueductal asymmetry appears
stable since the 5669 comparison. No abnormal enhancement
identified. Mastoids are clear. Normal stylomastoid foramina.
Visualized parotid glands are normal.

MRA HEAD FINDINGS

Antegrade flow in the posterior circulation with codominant distal
vertebral arteries. No distal vertebral stenosis. Patent PICA
origins. Patent vertebrobasilar junction. No basilar stenosis.
Normal SCA and PCA origins. Posterior communicating arteries are
diminutive or absent. Bilateral PCA branches are mildly irregular
compatible with atherosclerosis, but patent without significant
stenosis.

No abnormal flow signal about the bilateral temporal bones.

Antegrade flow in both ICA siphons. The right siphon appears mildly
dominant. No siphon stenosis. Small infundibula at both ophthalmic
artery 0 origins. Small infundibula also at the right ICA terminus.
Normal MCA and ACA origins. The right ACA A1 segment is mildly
dominant. Anterior communicating artery and visible ACA branches are
within normal limits. Median artery of the corpus callosum normal
variant is present. There is early MCA branching more so on the
left. Bilateral MCA origins and visible bilateral MCA branches
appear normal.
IMPRESSION: 1. IAC imaging remarkable for chronic asymmetric enlargement of the
left vestibular aqueduct suspicious for left side Large vestibular
aqueduct syndrome (LVAS).
2. Otherwise normal IAC imaging. No evidence of endolymphatic sac
tumor. No abnormal temporal bone region vascularity on MRA.
3. No acute intracranial abnormality. Mild generalized cerebral
volume loss since 8914 and moderate for age nonspecific cerebral
white matter signal changes which are most commonly due to chronic
small vessel disease. Chronic ventricular prominence felt to be ex
vacuo related.
4. Negative for age intracranial MRA.

## 2020-06-10 DIAGNOSIS — Z23 Encounter for immunization: Secondary | ICD-10-CM | POA: Diagnosis not present

## 2020-06-10 DIAGNOSIS — E1169 Type 2 diabetes mellitus with other specified complication: Secondary | ICD-10-CM | POA: Diagnosis not present

## 2020-06-10 DIAGNOSIS — Z1389 Encounter for screening for other disorder: Secondary | ICD-10-CM | POA: Diagnosis not present

## 2020-06-10 DIAGNOSIS — I10 Essential (primary) hypertension: Secondary | ICD-10-CM | POA: Diagnosis not present

## 2020-06-10 DIAGNOSIS — Z0000 Encounter for general adult medical examination without abnormal findings: Secondary | ICD-10-CM | POA: Diagnosis not present

## 2020-06-10 DIAGNOSIS — N401 Enlarged prostate with lower urinary tract symptoms: Secondary | ICD-10-CM | POA: Diagnosis not present

## 2020-06-10 DIAGNOSIS — E7800 Pure hypercholesterolemia, unspecified: Secondary | ICD-10-CM | POA: Diagnosis not present

## 2020-10-25 DIAGNOSIS — H2513 Age-related nuclear cataract, bilateral: Secondary | ICD-10-CM | POA: Diagnosis not present

## 2020-10-25 DIAGNOSIS — H5201 Hypermetropia, right eye: Secondary | ICD-10-CM | POA: Diagnosis not present

## 2020-11-11 DIAGNOSIS — L570 Actinic keratosis: Secondary | ICD-10-CM | POA: Diagnosis not present

## 2020-11-11 DIAGNOSIS — L820 Inflamed seborrheic keratosis: Secondary | ICD-10-CM | POA: Diagnosis not present

## 2020-11-11 DIAGNOSIS — L309 Dermatitis, unspecified: Secondary | ICD-10-CM | POA: Diagnosis not present

## 2020-11-11 DIAGNOSIS — D1801 Hemangioma of skin and subcutaneous tissue: Secondary | ICD-10-CM | POA: Diagnosis not present

## 2020-11-11 DIAGNOSIS — L821 Other seborrheic keratosis: Secondary | ICD-10-CM | POA: Diagnosis not present

## 2020-11-11 DIAGNOSIS — D225 Melanocytic nevi of trunk: Secondary | ICD-10-CM | POA: Diagnosis not present

## 2020-12-07 DIAGNOSIS — E1169 Type 2 diabetes mellitus with other specified complication: Secondary | ICD-10-CM | POA: Diagnosis not present

## 2020-12-07 DIAGNOSIS — Z23 Encounter for immunization: Secondary | ICD-10-CM | POA: Diagnosis not present

## 2020-12-07 DIAGNOSIS — R0989 Other specified symptoms and signs involving the circulatory and respiratory systems: Secondary | ICD-10-CM | POA: Diagnosis not present

## 2020-12-07 DIAGNOSIS — N401 Enlarged prostate with lower urinary tract symptoms: Secondary | ICD-10-CM | POA: Diagnosis not present

## 2020-12-07 DIAGNOSIS — I10 Essential (primary) hypertension: Secondary | ICD-10-CM | POA: Diagnosis not present

## 2021-02-11 DIAGNOSIS — H2513 Age-related nuclear cataract, bilateral: Secondary | ICD-10-CM | POA: Diagnosis not present

## 2021-04-13 DIAGNOSIS — H25811 Combined forms of age-related cataract, right eye: Secondary | ICD-10-CM | POA: Diagnosis not present

## 2021-04-13 DIAGNOSIS — H2511 Age-related nuclear cataract, right eye: Secondary | ICD-10-CM | POA: Diagnosis not present

## 2021-04-15 DIAGNOSIS — S0501XA Injury of conjunctiva and corneal abrasion without foreign body, right eye, initial encounter: Secondary | ICD-10-CM | POA: Diagnosis not present

## 2021-05-04 DIAGNOSIS — H2512 Age-related nuclear cataract, left eye: Secondary | ICD-10-CM | POA: Diagnosis not present

## 2021-05-04 DIAGNOSIS — H25812 Combined forms of age-related cataract, left eye: Secondary | ICD-10-CM | POA: Diagnosis not present

## 2021-05-04 DIAGNOSIS — H21562 Pupillary abnormality, left eye: Secondary | ICD-10-CM | POA: Diagnosis not present

## 2021-05-21 DIAGNOSIS — S61432A Puncture wound without foreign body of left hand, initial encounter: Secondary | ICD-10-CM | POA: Diagnosis not present

## 2021-05-21 DIAGNOSIS — L03114 Cellulitis of left upper limb: Secondary | ICD-10-CM | POA: Diagnosis not present

## 2021-05-21 DIAGNOSIS — W540XXA Bitten by dog, initial encounter: Secondary | ICD-10-CM | POA: Diagnosis not present

## 2021-05-21 DIAGNOSIS — S61412A Laceration without foreign body of left hand, initial encounter: Secondary | ICD-10-CM | POA: Diagnosis not present

## 2021-06-15 DIAGNOSIS — Z961 Presence of intraocular lens: Secondary | ICD-10-CM | POA: Diagnosis not present

## 2021-06-17 DIAGNOSIS — Z0000 Encounter for general adult medical examination without abnormal findings: Secondary | ICD-10-CM | POA: Diagnosis not present

## 2021-06-17 DIAGNOSIS — Z1389 Encounter for screening for other disorder: Secondary | ICD-10-CM | POA: Diagnosis not present

## 2021-06-17 DIAGNOSIS — Z23 Encounter for immunization: Secondary | ICD-10-CM | POA: Diagnosis not present

## 2021-06-17 DIAGNOSIS — R269 Unspecified abnormalities of gait and mobility: Secondary | ICD-10-CM | POA: Diagnosis not present

## 2021-06-17 DIAGNOSIS — I10 Essential (primary) hypertension: Secondary | ICD-10-CM | POA: Diagnosis not present

## 2021-06-17 DIAGNOSIS — E1169 Type 2 diabetes mellitus with other specified complication: Secondary | ICD-10-CM | POA: Diagnosis not present

## 2021-06-17 DIAGNOSIS — I77819 Aortic ectasia, unspecified site: Secondary | ICD-10-CM | POA: Diagnosis not present

## 2021-06-17 DIAGNOSIS — E7800 Pure hypercholesterolemia, unspecified: Secondary | ICD-10-CM | POA: Diagnosis not present

## 2021-10-04 DIAGNOSIS — R3915 Urgency of urination: Secondary | ICD-10-CM | POA: Diagnosis not present

## 2021-10-04 DIAGNOSIS — R269 Unspecified abnormalities of gait and mobility: Secondary | ICD-10-CM | POA: Diagnosis not present

## 2021-10-04 DIAGNOSIS — R2689 Other abnormalities of gait and mobility: Secondary | ICD-10-CM | POA: Diagnosis not present

## 2021-10-14 DIAGNOSIS — G9389 Other specified disorders of brain: Secondary | ICD-10-CM | POA: Diagnosis not present

## 2021-10-20 DIAGNOSIS — R3915 Urgency of urination: Secondary | ICD-10-CM | POA: Diagnosis not present

## 2021-10-20 DIAGNOSIS — R9389 Abnormal findings on diagnostic imaging of other specified body structures: Secondary | ICD-10-CM | POA: Diagnosis not present

## 2021-10-20 DIAGNOSIS — R269 Unspecified abnormalities of gait and mobility: Secondary | ICD-10-CM | POA: Diagnosis not present

## 2021-11-07 DIAGNOSIS — H04123 Dry eye syndrome of bilateral lacrimal glands: Secondary | ICD-10-CM | POA: Diagnosis not present

## 2021-11-07 DIAGNOSIS — E119 Type 2 diabetes mellitus without complications: Secondary | ICD-10-CM | POA: Diagnosis not present

## 2021-11-07 DIAGNOSIS — Z961 Presence of intraocular lens: Secondary | ICD-10-CM | POA: Diagnosis not present

## 2021-11-10 DIAGNOSIS — G912 (Idiopathic) normal pressure hydrocephalus: Secondary | ICD-10-CM | POA: Diagnosis not present

## 2021-11-15 DIAGNOSIS — L821 Other seborrheic keratosis: Secondary | ICD-10-CM | POA: Diagnosis not present

## 2021-11-15 DIAGNOSIS — L820 Inflamed seborrheic keratosis: Secondary | ICD-10-CM | POA: Diagnosis not present

## 2021-11-15 DIAGNOSIS — D225 Melanocytic nevi of trunk: Secondary | ICD-10-CM | POA: Diagnosis not present

## 2021-11-15 DIAGNOSIS — L570 Actinic keratosis: Secondary | ICD-10-CM | POA: Diagnosis not present

## 2021-11-15 DIAGNOSIS — D485 Neoplasm of uncertain behavior of skin: Secondary | ICD-10-CM | POA: Diagnosis not present

## 2021-12-15 DIAGNOSIS — M6281 Muscle weakness (generalized): Secondary | ICD-10-CM | POA: Diagnosis not present

## 2021-12-15 DIAGNOSIS — G912 (Idiopathic) normal pressure hydrocephalus: Secondary | ICD-10-CM | POA: Diagnosis not present

## 2021-12-15 DIAGNOSIS — R2689 Other abnormalities of gait and mobility: Secondary | ICD-10-CM | POA: Diagnosis not present

## 2021-12-15 DIAGNOSIS — R2681 Unsteadiness on feet: Secondary | ICD-10-CM | POA: Diagnosis not present

## 2021-12-16 DIAGNOSIS — G9389 Other specified disorders of brain: Secondary | ICD-10-CM | POA: Diagnosis not present

## 2021-12-16 DIAGNOSIS — R2689 Other abnormalities of gait and mobility: Secondary | ICD-10-CM | POA: Diagnosis not present

## 2021-12-16 DIAGNOSIS — G912 (Idiopathic) normal pressure hydrocephalus: Secondary | ICD-10-CM | POA: Diagnosis not present

## 2021-12-16 DIAGNOSIS — R32 Unspecified urinary incontinence: Secondary | ICD-10-CM | POA: Diagnosis not present

## 2021-12-19 DIAGNOSIS — G912 (Idiopathic) normal pressure hydrocephalus: Secondary | ICD-10-CM | POA: Diagnosis not present

## 2021-12-28 DIAGNOSIS — D485 Neoplasm of uncertain behavior of skin: Secondary | ICD-10-CM | POA: Diagnosis not present

## 2021-12-28 DIAGNOSIS — L821 Other seborrheic keratosis: Secondary | ICD-10-CM | POA: Diagnosis not present

## 2022-01-03 DIAGNOSIS — G912 (Idiopathic) normal pressure hydrocephalus: Secondary | ICD-10-CM | POA: Diagnosis not present

## 2022-01-03 DIAGNOSIS — Z01810 Encounter for preprocedural cardiovascular examination: Secondary | ICD-10-CM | POA: Diagnosis not present

## 2022-01-10 DIAGNOSIS — E785 Hyperlipidemia, unspecified: Secondary | ICD-10-CM | POA: Diagnosis not present

## 2022-01-10 DIAGNOSIS — Z87891 Personal history of nicotine dependence: Secondary | ICD-10-CM | POA: Diagnosis not present

## 2022-01-10 DIAGNOSIS — E669 Obesity, unspecified: Secondary | ICD-10-CM | POA: Diagnosis not present

## 2022-01-10 DIAGNOSIS — Z982 Presence of cerebrospinal fluid drainage device: Secondary | ICD-10-CM | POA: Diagnosis not present

## 2022-01-10 DIAGNOSIS — Z9081 Acquired absence of spleen: Secondary | ICD-10-CM | POA: Diagnosis not present

## 2022-01-10 DIAGNOSIS — E119 Type 2 diabetes mellitus without complications: Secondary | ICD-10-CM | POA: Diagnosis not present

## 2022-01-10 DIAGNOSIS — G912 (Idiopathic) normal pressure hydrocephalus: Secondary | ICD-10-CM | POA: Insufficient documentation

## 2022-01-10 DIAGNOSIS — I10 Essential (primary) hypertension: Secondary | ICD-10-CM | POA: Diagnosis not present

## 2022-01-10 DIAGNOSIS — Z6832 Body mass index (BMI) 32.0-32.9, adult: Secondary | ICD-10-CM | POA: Diagnosis not present

## 2022-01-10 DIAGNOSIS — G4730 Sleep apnea, unspecified: Secondary | ICD-10-CM | POA: Diagnosis not present

## 2022-02-13 DIAGNOSIS — G912 (Idiopathic) normal pressure hydrocephalus: Secondary | ICD-10-CM | POA: Diagnosis not present

## 2022-02-15 DIAGNOSIS — E1169 Type 2 diabetes mellitus with other specified complication: Secondary | ICD-10-CM | POA: Diagnosis not present

## 2022-02-15 DIAGNOSIS — G912 (Idiopathic) normal pressure hydrocephalus: Secondary | ICD-10-CM | POA: Diagnosis not present

## 2022-02-15 DIAGNOSIS — I10 Essential (primary) hypertension: Secondary | ICD-10-CM | POA: Diagnosis not present

## 2022-04-06 DIAGNOSIS — R2689 Other abnormalities of gait and mobility: Secondary | ICD-10-CM | POA: Diagnosis not present

## 2022-04-06 DIAGNOSIS — G912 (Idiopathic) normal pressure hydrocephalus: Secondary | ICD-10-CM | POA: Diagnosis not present

## 2022-04-11 DIAGNOSIS — G912 (Idiopathic) normal pressure hydrocephalus: Secondary | ICD-10-CM | POA: Diagnosis not present

## 2022-04-11 DIAGNOSIS — R2689 Other abnormalities of gait and mobility: Secondary | ICD-10-CM | POA: Diagnosis not present

## 2022-04-18 DIAGNOSIS — G912 (Idiopathic) normal pressure hydrocephalus: Secondary | ICD-10-CM | POA: Diagnosis not present

## 2022-04-18 DIAGNOSIS — R2689 Other abnormalities of gait and mobility: Secondary | ICD-10-CM | POA: Diagnosis not present

## 2022-04-20 DIAGNOSIS — Z9689 Presence of other specified functional implants: Secondary | ICD-10-CM | POA: Diagnosis not present

## 2022-04-20 DIAGNOSIS — G912 (Idiopathic) normal pressure hydrocephalus: Secondary | ICD-10-CM | POA: Diagnosis not present

## 2022-04-20 DIAGNOSIS — R2689 Other abnormalities of gait and mobility: Secondary | ICD-10-CM | POA: Diagnosis not present

## 2022-04-24 DIAGNOSIS — R2681 Unsteadiness on feet: Secondary | ICD-10-CM | POA: Diagnosis not present

## 2022-04-24 DIAGNOSIS — Z7409 Other reduced mobility: Secondary | ICD-10-CM | POA: Diagnosis not present

## 2022-04-24 DIAGNOSIS — R29898 Other symptoms and signs involving the musculoskeletal system: Secondary | ICD-10-CM | POA: Diagnosis not present

## 2022-04-24 DIAGNOSIS — R2689 Other abnormalities of gait and mobility: Secondary | ICD-10-CM | POA: Diagnosis not present

## 2022-04-24 DIAGNOSIS — G912 (Idiopathic) normal pressure hydrocephalus: Secondary | ICD-10-CM | POA: Diagnosis not present

## 2022-04-26 DIAGNOSIS — G912 (Idiopathic) normal pressure hydrocephalus: Secondary | ICD-10-CM | POA: Diagnosis not present

## 2022-04-26 DIAGNOSIS — R2689 Other abnormalities of gait and mobility: Secondary | ICD-10-CM | POA: Diagnosis not present

## 2022-05-01 DIAGNOSIS — R2689 Other abnormalities of gait and mobility: Secondary | ICD-10-CM | POA: Diagnosis not present

## 2022-05-01 DIAGNOSIS — G912 (Idiopathic) normal pressure hydrocephalus: Secondary | ICD-10-CM | POA: Diagnosis not present

## 2022-05-04 DIAGNOSIS — R2689 Other abnormalities of gait and mobility: Secondary | ICD-10-CM | POA: Diagnosis not present

## 2022-05-04 DIAGNOSIS — G912 (Idiopathic) normal pressure hydrocephalus: Secondary | ICD-10-CM | POA: Diagnosis not present

## 2022-05-05 DIAGNOSIS — G912 (Idiopathic) normal pressure hydrocephalus: Secondary | ICD-10-CM | POA: Diagnosis not present

## 2022-05-05 DIAGNOSIS — R2681 Unsteadiness on feet: Secondary | ICD-10-CM | POA: Diagnosis not present

## 2022-05-05 DIAGNOSIS — R29898 Other symptoms and signs involving the musculoskeletal system: Secondary | ICD-10-CM | POA: Diagnosis not present

## 2022-05-10 DIAGNOSIS — R2689 Other abnormalities of gait and mobility: Secondary | ICD-10-CM | POA: Diagnosis not present

## 2022-05-10 DIAGNOSIS — R293 Abnormal posture: Secondary | ICD-10-CM | POA: Diagnosis not present

## 2022-05-10 DIAGNOSIS — G912 (Idiopathic) normal pressure hydrocephalus: Secondary | ICD-10-CM | POA: Diagnosis not present

## 2022-05-11 DIAGNOSIS — G912 (Idiopathic) normal pressure hydrocephalus: Secondary | ICD-10-CM | POA: Diagnosis not present

## 2022-05-16 DIAGNOSIS — U071 COVID-19: Secondary | ICD-10-CM | POA: Diagnosis not present

## 2022-06-19 DIAGNOSIS — R2681 Unsteadiness on feet: Secondary | ICD-10-CM | POA: Diagnosis not present

## 2022-06-19 DIAGNOSIS — G912 (Idiopathic) normal pressure hydrocephalus: Secondary | ICD-10-CM | POA: Diagnosis not present

## 2022-06-19 DIAGNOSIS — Z7409 Other reduced mobility: Secondary | ICD-10-CM | POA: Diagnosis not present

## 2022-06-19 DIAGNOSIS — R2689 Other abnormalities of gait and mobility: Secondary | ICD-10-CM | POA: Diagnosis not present

## 2022-06-29 DIAGNOSIS — R2681 Unsteadiness on feet: Secondary | ICD-10-CM | POA: Diagnosis not present

## 2022-06-29 DIAGNOSIS — R2689 Other abnormalities of gait and mobility: Secondary | ICD-10-CM | POA: Diagnosis not present

## 2022-06-29 DIAGNOSIS — Z7409 Other reduced mobility: Secondary | ICD-10-CM | POA: Diagnosis not present

## 2022-06-29 DIAGNOSIS — G912 (Idiopathic) normal pressure hydrocephalus: Secondary | ICD-10-CM | POA: Diagnosis not present

## 2022-07-03 DIAGNOSIS — R2689 Other abnormalities of gait and mobility: Secondary | ICD-10-CM | POA: Diagnosis not present

## 2022-07-03 DIAGNOSIS — R2681 Unsteadiness on feet: Secondary | ICD-10-CM | POA: Diagnosis not present

## 2022-07-03 DIAGNOSIS — G912 (Idiopathic) normal pressure hydrocephalus: Secondary | ICD-10-CM | POA: Diagnosis not present

## 2022-07-03 DIAGNOSIS — Z7409 Other reduced mobility: Secondary | ICD-10-CM | POA: Diagnosis not present

## 2022-07-10 DIAGNOSIS — R2681 Unsteadiness on feet: Secondary | ICD-10-CM | POA: Diagnosis not present

## 2022-07-10 DIAGNOSIS — Z7409 Other reduced mobility: Secondary | ICD-10-CM | POA: Diagnosis not present

## 2022-07-10 DIAGNOSIS — R2689 Other abnormalities of gait and mobility: Secondary | ICD-10-CM | POA: Diagnosis not present

## 2022-07-10 DIAGNOSIS — G912 (Idiopathic) normal pressure hydrocephalus: Secondary | ICD-10-CM | POA: Diagnosis not present

## 2022-07-12 DIAGNOSIS — R2681 Unsteadiness on feet: Secondary | ICD-10-CM | POA: Diagnosis not present

## 2022-07-12 DIAGNOSIS — Z7409 Other reduced mobility: Secondary | ICD-10-CM | POA: Diagnosis not present

## 2022-07-12 DIAGNOSIS — G912 (Idiopathic) normal pressure hydrocephalus: Secondary | ICD-10-CM | POA: Diagnosis not present

## 2022-07-12 DIAGNOSIS — R2689 Other abnormalities of gait and mobility: Secondary | ICD-10-CM | POA: Diagnosis not present

## 2022-07-17 DIAGNOSIS — G912 (Idiopathic) normal pressure hydrocephalus: Secondary | ICD-10-CM | POA: Diagnosis not present

## 2022-07-17 DIAGNOSIS — R2681 Unsteadiness on feet: Secondary | ICD-10-CM | POA: Diagnosis not present

## 2022-07-17 DIAGNOSIS — R2689 Other abnormalities of gait and mobility: Secondary | ICD-10-CM | POA: Diagnosis not present

## 2022-07-17 DIAGNOSIS — Z7409 Other reduced mobility: Secondary | ICD-10-CM | POA: Diagnosis not present

## 2022-08-10 DIAGNOSIS — Z9081 Acquired absence of spleen: Secondary | ICD-10-CM | POA: Diagnosis not present

## 2022-08-10 DIAGNOSIS — E1169 Type 2 diabetes mellitus with other specified complication: Secondary | ICD-10-CM | POA: Diagnosis not present

## 2022-08-10 DIAGNOSIS — G912 (Idiopathic) normal pressure hydrocephalus: Secondary | ICD-10-CM | POA: Diagnosis not present

## 2022-12-05 ENCOUNTER — Encounter: Payer: Self-pay | Admitting: Internal Medicine

## 2022-12-06 ENCOUNTER — Other Ambulatory Visit: Payer: Self-pay | Admitting: Internal Medicine

## 2022-12-06 DIAGNOSIS — Z982 Presence of cerebrospinal fluid drainage device: Secondary | ICD-10-CM

## 2022-12-06 DIAGNOSIS — R1084 Generalized abdominal pain: Secondary | ICD-10-CM

## 2022-12-19 DIAGNOSIS — R3915 Urgency of urination: Secondary | ICD-10-CM | POA: Diagnosis not present

## 2022-12-19 DIAGNOSIS — R269 Unspecified abnormalities of gait and mobility: Secondary | ICD-10-CM | POA: Diagnosis not present

## 2022-12-19 DIAGNOSIS — Z1330 Encounter for screening examination for mental health and behavioral disorders, unspecified: Secondary | ICD-10-CM | POA: Diagnosis not present

## 2022-12-19 DIAGNOSIS — R202 Paresthesia of skin: Secondary | ICD-10-CM | POA: Diagnosis not present

## 2022-12-19 DIAGNOSIS — R9389 Abnormal findings on diagnostic imaging of other specified body structures: Secondary | ICD-10-CM | POA: Diagnosis not present

## 2022-12-19 DIAGNOSIS — R7303 Prediabetes: Secondary | ICD-10-CM | POA: Diagnosis not present

## 2022-12-19 DIAGNOSIS — R2689 Other abnormalities of gait and mobility: Secondary | ICD-10-CM | POA: Diagnosis not present

## 2022-12-19 DIAGNOSIS — R200 Anesthesia of skin: Secondary | ICD-10-CM | POA: Diagnosis not present

## 2023-01-05 ENCOUNTER — Ambulatory Visit
Admission: RE | Admit: 2023-01-05 | Discharge: 2023-01-05 | Disposition: A | Discharge status: Home / Self Care | Payer: Medicare PPO | Source: Ambulatory Visit | Attending: Internal Medicine | Admitting: Internal Medicine

## 2023-01-05 DIAGNOSIS — R109 Unspecified abdominal pain: Secondary | ICD-10-CM | POA: Diagnosis not present

## 2023-01-05 DIAGNOSIS — Z982 Presence of cerebrospinal fluid drainage device: Secondary | ICD-10-CM

## 2023-01-05 DIAGNOSIS — R1084 Generalized abdominal pain: Secondary | ICD-10-CM

## 2023-01-05 MED ORDER — IOPAMIDOL (ISOVUE-300) INJECTION 61%
100.0000 mL | Freq: Once | INTRAVENOUS | Status: AC | PRN
  Administered 2023-01-05: 100 mL via INTRAVENOUS

## 2023-01-17 DIAGNOSIS — H903 Sensorineural hearing loss, bilateral: Secondary | ICD-10-CM | POA: Diagnosis not present

## 2023-01-18 DIAGNOSIS — R1084 Generalized abdominal pain: Secondary | ICD-10-CM | POA: Diagnosis not present

## 2023-01-18 DIAGNOSIS — I77819 Aortic ectasia, unspecified site: Secondary | ICD-10-CM | POA: Diagnosis not present

## 2023-01-18 DIAGNOSIS — R7989 Other specified abnormal findings of blood chemistry: Secondary | ICD-10-CM | POA: Diagnosis not present

## 2023-01-18 DIAGNOSIS — R799 Abnormal finding of blood chemistry, unspecified: Secondary | ICD-10-CM | POA: Diagnosis not present

## 2023-02-02 DIAGNOSIS — R1084 Generalized abdominal pain: Secondary | ICD-10-CM | POA: Diagnosis not present

## 2023-02-06 DIAGNOSIS — R200 Anesthesia of skin: Secondary | ICD-10-CM | POA: Diagnosis not present

## 2023-02-06 DIAGNOSIS — R202 Paresthesia of skin: Secondary | ICD-10-CM | POA: Diagnosis not present

## 2023-02-06 DIAGNOSIS — R2689 Other abnormalities of gait and mobility: Secondary | ICD-10-CM | POA: Diagnosis not present

## 2023-04-16 DIAGNOSIS — H903 Sensorineural hearing loss, bilateral: Secondary | ICD-10-CM | POA: Diagnosis not present

## 2023-11-19 DIAGNOSIS — L309 Dermatitis, unspecified: Secondary | ICD-10-CM | POA: Diagnosis not present

## 2023-11-19 DIAGNOSIS — D224 Melanocytic nevi of scalp and neck: Secondary | ICD-10-CM | POA: Diagnosis not present

## 2023-11-19 DIAGNOSIS — L570 Actinic keratosis: Secondary | ICD-10-CM | POA: Diagnosis not present

## 2023-11-19 DIAGNOSIS — L821 Other seborrheic keratosis: Secondary | ICD-10-CM | POA: Diagnosis not present

## 2023-11-19 DIAGNOSIS — D225 Melanocytic nevi of trunk: Secondary | ICD-10-CM | POA: Diagnosis not present

## 2023-11-19 DIAGNOSIS — L723 Sebaceous cyst: Secondary | ICD-10-CM | POA: Diagnosis not present

## 2023-11-19 DIAGNOSIS — D2261 Melanocytic nevi of right upper limb, including shoulder: Secondary | ICD-10-CM | POA: Diagnosis not present

## 2023-11-19 DIAGNOSIS — D2262 Melanocytic nevi of left upper limb, including shoulder: Secondary | ICD-10-CM | POA: Diagnosis not present

## 2023-11-21 DIAGNOSIS — E1169 Type 2 diabetes mellitus with other specified complication: Secondary | ICD-10-CM | POA: Diagnosis not present

## 2023-11-21 DIAGNOSIS — Z982 Presence of cerebrospinal fluid drainage device: Secondary | ICD-10-CM | POA: Diagnosis not present

## 2023-11-21 DIAGNOSIS — N401 Enlarged prostate with lower urinary tract symptoms: Secondary | ICD-10-CM | POA: Diagnosis not present

## 2023-11-21 DIAGNOSIS — G5603 Carpal tunnel syndrome, bilateral upper limbs: Secondary | ICD-10-CM | POA: Diagnosis not present

## 2023-11-21 DIAGNOSIS — I10 Essential (primary) hypertension: Secondary | ICD-10-CM | POA: Diagnosis not present

## 2023-11-21 DIAGNOSIS — Z9081 Acquired absence of spleen: Secondary | ICD-10-CM | POA: Diagnosis not present

## 2023-11-21 DIAGNOSIS — Z79899 Other long term (current) drug therapy: Secondary | ICD-10-CM | POA: Diagnosis not present

## 2023-11-21 DIAGNOSIS — Z1331 Encounter for screening for depression: Secondary | ICD-10-CM | POA: Diagnosis not present

## 2023-11-21 DIAGNOSIS — Z860100 Personal history of colon polyps, unspecified: Secondary | ICD-10-CM | POA: Diagnosis not present

## 2023-11-21 DIAGNOSIS — Z0000 Encounter for general adult medical examination without abnormal findings: Secondary | ICD-10-CM | POA: Diagnosis not present

## 2023-11-21 DIAGNOSIS — G912 (Idiopathic) normal pressure hydrocephalus: Secondary | ICD-10-CM | POA: Diagnosis not present

## 2023-11-29 DIAGNOSIS — Z961 Presence of intraocular lens: Secondary | ICD-10-CM | POA: Diagnosis not present

## 2023-11-29 DIAGNOSIS — E119 Type 2 diabetes mellitus without complications: Secondary | ICD-10-CM | POA: Diagnosis not present

## 2023-12-19 DIAGNOSIS — R202 Paresthesia of skin: Secondary | ICD-10-CM | POA: Diagnosis not present

## 2023-12-19 DIAGNOSIS — Z982 Presence of cerebrospinal fluid drainage device: Secondary | ICD-10-CM | POA: Diagnosis not present

## 2023-12-19 DIAGNOSIS — Z1330 Encounter for screening examination for mental health and behavioral disorders, unspecified: Secondary | ICD-10-CM | POA: Diagnosis not present

## 2023-12-19 DIAGNOSIS — G912 (Idiopathic) normal pressure hydrocephalus: Secondary | ICD-10-CM | POA: Diagnosis not present

## 2023-12-19 DIAGNOSIS — R200 Anesthesia of skin: Secondary | ICD-10-CM | POA: Diagnosis not present

## 2023-12-19 DIAGNOSIS — R7303 Prediabetes: Secondary | ICD-10-CM | POA: Diagnosis not present

## 2023-12-26 DIAGNOSIS — N400 Enlarged prostate without lower urinary tract symptoms: Secondary | ICD-10-CM | POA: Diagnosis not present

## 2023-12-26 DIAGNOSIS — N3281 Overactive bladder: Secondary | ICD-10-CM | POA: Diagnosis not present

## 2023-12-26 DIAGNOSIS — R200 Anesthesia of skin: Secondary | ICD-10-CM | POA: Diagnosis not present

## 2023-12-26 DIAGNOSIS — R202 Paresthesia of skin: Secondary | ICD-10-CM | POA: Diagnosis not present

## 2024-02-06 DIAGNOSIS — N400 Enlarged prostate without lower urinary tract symptoms: Secondary | ICD-10-CM | POA: Diagnosis not present

## 2024-02-06 DIAGNOSIS — N3941 Urge incontinence: Secondary | ICD-10-CM | POA: Diagnosis not present

## 2024-02-06 DIAGNOSIS — R351 Nocturia: Secondary | ICD-10-CM | POA: Diagnosis not present

## 2024-02-06 DIAGNOSIS — N3281 Overactive bladder: Secondary | ICD-10-CM | POA: Diagnosis not present

## 2024-04-22 ENCOUNTER — Ambulatory Visit (INDEPENDENT_AMBULATORY_CARE_PROVIDER_SITE_OTHER): Payer: Medicare PPO | Admitting: Otolaryngology

## 2024-04-22 ENCOUNTER — Encounter (INDEPENDENT_AMBULATORY_CARE_PROVIDER_SITE_OTHER): Payer: Self-pay | Admitting: Otolaryngology

## 2024-04-22 VITALS — BP 152/69 | HR 75

## 2024-04-22 DIAGNOSIS — H903 Sensorineural hearing loss, bilateral: Secondary | ICD-10-CM | POA: Diagnosis not present

## 2024-04-22 DIAGNOSIS — H6122 Impacted cerumen, left ear: Secondary | ICD-10-CM

## 2024-04-22 DIAGNOSIS — Q165 Congenital malformation of inner ear: Secondary | ICD-10-CM

## 2024-04-22 NOTE — Progress Notes (Unsigned)
 Patient ID: Douglas Burch., male   DOB: [birth year], 83 y.o.   MRN: [id number]  Follow up: Hearing loss  HPI: The patient is an 83 year old male who returns today for follow-up evaluation of his hearing loss.  His hearing loss is worse on the left side.  He was previously seen by Dr. Camellia Milliner.  His MRI scan showed enlarged vestibular aqueduct on the left side.  No retrocochlear lesion was noted.  He was fitted with bilateral hearing aids.  The patient returns today complaining of progressive hearing difficulty.  He denies any otalgia, otorrhea, or vertigo.  He is using his hearing aids daily.  Exam: General: Communicates without difficulty, well nourished, no acute distress. Head: Normocephalic, no evidence injury, no tenderness, facial buttresses intact without stepoff. Face/sinus: No tenderness to palpation and percussion. Facial movement is normal and symmetric. Eyes: PERRL, EOMI. No scleral icterus, conjunctivae clear. Neuro: CN II exam reveals vision grossly intact.  No nystagmus at any point of gaze. Ears: Auricles well formed without lesions.  Left ear cerumen impaction.  The right ear canal and tympanic membrane are normal.  Nose: External evaluation reveals normal support and skin without lesions.  Dorsum is intact.  Anterior rhinoscopy reveals congested mucosa over anterior aspect of inferior turbinates and intact septum.  No purulence noted. Oral:  Oral cavity and oropharynx are intact, symmetric, without erythema or edema.  Mucosa is moist without lesions. Neck: Full range of motion without pain.  There is no significant lymphadenopathy.  No masses palpable.  Thyroid  bed within normal limits to palpation.  Parotid glands and submandibular glands equal bilaterally without mass.  Trachea is midline. Neuro:  CN 2-12 grossly intact.   Procedure: Left ear cerumen disimpaction Anesthesia: None Description: Under the operating microscope, the cerumen is carefully removed with a combination of  cerumen currette, alligator forceps, and suction catheters.  After the cerumen is removed, the TMs are noted to be normal.  No mass, erythema, or lesions. The patient tolerated the procedure well.    Assessment: 1.  Left ear cerumen impaction.  After the disimpaction procedure, both tympanic membranes and middle ear spaces are noted to be normal. 2.  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 2.  His previous MRI scan showed enlarged left vestibular aqueduct.  No retrocochlear lesion was noted.  Plan: 1.  Otomicroscopy with left ear cerumen disimpaction. 2.  The physical exam findings are reviewed with the patient. 3.  The patient will have his hearing test performed at [facility].  Pending the results of the hearing test, he may need to have his hearing aids adjusted. 4.  The patient will return for reevaluation in 1 year, sooner if needed.

## 2024-04-23 DIAGNOSIS — H6122 Impacted cerumen, left ear: Secondary | ICD-10-CM | POA: Insufficient documentation

## 2024-04-23 DIAGNOSIS — Q165 Congenital malformation of inner ear: Secondary | ICD-10-CM | POA: Insufficient documentation

## 2024-04-23 DIAGNOSIS — H903 Sensorineural hearing loss, bilateral: Secondary | ICD-10-CM | POA: Insufficient documentation

## 2024-05-20 DIAGNOSIS — H903 Sensorineural hearing loss, bilateral: Secondary | ICD-10-CM | POA: Diagnosis not present

## 2024-06-25 DIAGNOSIS — R200 Anesthesia of skin: Secondary | ICD-10-CM | POA: Diagnosis not present

## 2024-06-25 DIAGNOSIS — R202 Paresthesia of skin: Secondary | ICD-10-CM | POA: Diagnosis not present

## 2024-06-25 DIAGNOSIS — R7303 Prediabetes: Secondary | ICD-10-CM | POA: Diagnosis not present

## 2024-06-25 DIAGNOSIS — G912 (Idiopathic) normal pressure hydrocephalus: Secondary | ICD-10-CM | POA: Diagnosis not present

## 2024-06-25 DIAGNOSIS — Z982 Presence of cerebrospinal fluid drainage device: Secondary | ICD-10-CM | POA: Diagnosis not present

## 2024-09-29 ENCOUNTER — Encounter: Payer: Self-pay | Admitting: Family Medicine

## 2024-09-29 DIAGNOSIS — E7849 Other hyperlipidemia: Secondary | ICD-10-CM | POA: Insufficient documentation

## 2024-09-29 DIAGNOSIS — I10 Essential (primary) hypertension: Secondary | ICD-10-CM | POA: Insufficient documentation

## 2024-09-29 DIAGNOSIS — G4733 Obstructive sleep apnea (adult) (pediatric): Secondary | ICD-10-CM | POA: Insufficient documentation

## 2024-10-02 ENCOUNTER — Encounter: Payer: Self-pay | Admitting: Family Medicine

## 2024-10-02 ENCOUNTER — Ambulatory Visit: Admitting: Family Medicine

## 2024-10-02 VITALS — BP 128/74 | HR 68 | Temp 99.0°F | Ht 69.0 in | Wt 209.2 lb

## 2024-10-02 DIAGNOSIS — I10 Essential (primary) hypertension: Secondary | ICD-10-CM | POA: Diagnosis not present

## 2024-10-02 DIAGNOSIS — Z982 Presence of cerebrospinal fluid drainage device: Secondary | ICD-10-CM

## 2024-10-02 DIAGNOSIS — G919 Hydrocephalus, unspecified: Secondary | ICD-10-CM | POA: Diagnosis not present

## 2024-10-02 DIAGNOSIS — E7849 Other hyperlipidemia: Secondary | ICD-10-CM | POA: Diagnosis not present

## 2024-10-02 DIAGNOSIS — N401 Enlarged prostate with lower urinary tract symptoms: Secondary | ICD-10-CM | POA: Diagnosis not present

## 2024-10-02 DIAGNOSIS — Z0000 Encounter for general adult medical examination without abnormal findings: Secondary | ICD-10-CM | POA: Diagnosis not present

## 2024-10-02 DIAGNOSIS — N3281 Overactive bladder: Secondary | ICD-10-CM

## 2024-10-02 DIAGNOSIS — R3915 Urgency of urination: Secondary | ICD-10-CM | POA: Diagnosis not present

## 2024-10-02 LAB — LIPID PANEL
Cholesterol: 138 mg/dL (ref 28–200)
HDL: 58.4 mg/dL
LDL Cholesterol: 63 mg/dL (ref 10–99)
NonHDL: 79.15
Total CHOL/HDL Ratio: 2
Triglycerides: 81 mg/dL (ref 10.0–149.0)
VLDL: 16.2 mg/dL (ref 0.0–40.0)

## 2024-10-02 LAB — COMPREHENSIVE METABOLIC PANEL WITH GFR
ALT: 13 U/L (ref 3–53)
AST: 15 U/L (ref 5–37)
Albumin: 4.1 g/dL (ref 3.5–5.2)
Alkaline Phosphatase: 55 U/L (ref 39–117)
BUN: 14 mg/dL (ref 6–23)
CO2: 28 meq/L (ref 19–32)
Calcium: 9.4 mg/dL (ref 8.4–10.5)
Chloride: 102 meq/L (ref 96–112)
Creatinine, Ser: 0.81 mg/dL (ref 0.40–1.50)
GFR: 82.23 mL/min
Glucose, Bld: 117 mg/dL — ABNORMAL HIGH (ref 70–99)
Potassium: 4.2 meq/L (ref 3.5–5.1)
Sodium: 136 meq/L (ref 135–145)
Total Bilirubin: 0.6 mg/dL (ref 0.2–1.2)
Total Protein: 7.7 g/dL (ref 6.0–8.3)

## 2024-10-02 NOTE — Progress Notes (Signed)
 " [facility] [address] [place] [address] Dept: [phone] Dept Fax: [phone]  New Patient Office Visit  Subjective:    Patient ID: Douglas Burch., male    DOB: [birth year], 84 y.o..   MRN: [id number]  Chief Complaint  Patient presents with   Establish Care    NP- establish care.     History of Present Illness:  Patient is in today to establish care. Douglas Burch was born in [place]. Douglas Burch grew up in [place]. Douglas Burch attended college at [employer], majoring in business administration. Douglas Burch then attended law school. 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 Douglas Burch has been married to Douglas Burch for 58 years. They have three children: one son and two daughters. They have one grandchild. Douglas Burch smoked briefly in his early 27s. Douglas Burch occasionally has a small drink of alcohol.  Douglas Burch has a history of hypertension. Douglas Burch is currently managed on amlodipine 5 mg daily and moexipril 15 mg daily.  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 Douglas Burch is managed on pravastatin 40 mg daily.   Douglas Burch has a history of BPH and overactive bladder. Douglas Burch is managed on tamsulosin 0.4 mg at bedtime and mirabegron ER 25 mg daily.  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   NPH (normal pressure hydrocephalus) (HCC) 01/10/2022   Hallux rigidus, right foot 11/04/2015   Hallux valgus of left foot 11/04/2015   Hammer toe of left foot 11/04/2015   Metatarsalgia, left foot 11/04/2015   Past Surgical History:  Procedure Laterality Date   CENTRAL SHUNT  01/10/2022   hydrocephalus w/ ventricular shunt   EYE SURGERY   04/2021   cataracts   NASAL SEPTUM SURGERY     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     ascorbic acid (VITAMIN C) 250 MG CHEW      betamethasone dipropionate (DIPROLENE) 0.05 % ointment Apply 1 Application topically.     cholecalciferol (VITAMIN D3) 25 MCG (1000 UNIT) tablet 1 tablet     FIBER PO 3 tablets     mirabegron ER (MYRBETRIQ) 25 MG TB24 tablet Take 25 mg by mouth daily.     moexipril (UNIVASC) 15 MG tablet Take 15 mg by mouth daily.     pravastatin (PRAVACHOL) 40 MG tablet Take 40 mg by mouth daily.     tamsulosin (FLOMAX) 0.4 MG CAPS capsule Take 0.4 mg by mouth daily.     triamcinolone cream (KENALOG) 0.1 % Apply 1 Application topically.  acetaminophen (TYLENOL) 325 MG tablet every 4 (four) hours.     No facility-administered medications prior to visit.   Allergies[1] Objective:   Today's Vitals   10/02/24 0834  BP: 128/74  Pulse: 68  Temp: 99 F (37.2 C)  TempSrc: Temporal  SpO2: 98%  Weight: 209 lb 3.2 oz (94.9 kg)  Height: 5' 9 (1.753 m)   Body mass index is 30.89 kg/m.   General: Well developed, well nourished. No acute distress. Psych: Alert and oriented. Normal mood and affect.  Health Maintenance Due  Topic Date Due   Zoster Vaccines- Shingrix (1 of 2) Never done   Medicare Annual Wellness (AWV)  11/20/2024     Assessment & Plan:   Problem List Items Addressed This Visit       Cardiovascular and Mediastinum   Essential hypertension - Primary    Blood pressured is in good control. Continue amlodipine 5 mg daily and moexipril 15 mg daily. I will check renal labs today.      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 No symptoms to suggest any issue with the function of his VP shunt.        Genitourinary   Overactive bladder   Follows with urology. Continue mirabegron ER 25 mg daily.        Other   Benign prostatic hyperplasia (BPH) with urinary urgency   Stable. Continue tamsulosin 0.4 mg at bedtime.      Familial combined hyperlipidemia   I will check lipids and liver enzymes. Continue pravastatin 40 mg daily.      Relevant Medications   amLODipine (NORVASC) 5 MG tablet   moexipril (UNIVASC) 15 MG tablet   pravastatin (PRAVACHOL) 40 MG tablet   Other Relevant Orders   Lipid panel    Return in about 4 months (around 01/30/2025) for Reassessment.   Garnette CHRISTELLA Simpler, MD    [1]  Allergies Allergen Reactions   Chitosan And Glucosamine (Shellfish-Derived) Nausea Only   Colesevelam Nausea Only   "

## 2024-10-02 NOTE — Assessment & Plan Note (Signed)
 I will check lipids and liver enzymes. Continue pravastatin 40 mg daily.

## 2024-10-02 NOTE — Assessment & Plan Note (Addendum)
 Blood pressured is in good control. Continue amlodipine 5 mg daily and moexipril 15 mg daily. I will check renal labs today.

## 2024-10-02 NOTE — Assessment & Plan Note (Signed)
Stable.  Continue tamsulosin 0.4 mg at bedtime

## 2024-10-02 NOTE — Assessment & Plan Note (Signed)
 Stable. No symptoms to suggest any issue with the function of his VP shunt.

## 2024-10-02 NOTE — Assessment & Plan Note (Signed)
 Follows with urology. Continue mirabegron ER 25 mg daily.

## 2024-10-03 ENCOUNTER — Ambulatory Visit: Payer: Self-pay | Admitting: Family Medicine

## 2025-01-29 ENCOUNTER — Ambulatory Visit: Admitting: Family Medicine
